# Patient Record
Sex: Female | Born: 1970 | Hispanic: No | Marital: Married | Smoking: Never smoker
Health system: Southern US, Community
[De-identification: ages and names within clinical notes are randomized; demographics above are authoritative.]

## PROBLEM LIST (undated history)

## (undated) DIAGNOSIS — I10 Essential (primary) hypertension: Secondary | ICD-10-CM

## (undated) DIAGNOSIS — E785 Hyperlipidemia, unspecified: Secondary | ICD-10-CM

## (undated) HISTORY — PX: APPENDECTOMY: SHX54

## (undated) HISTORY — PX: LEG SURGERY: SHX1003

---

## 2009-08-21 ENCOUNTER — Ambulatory Visit (HOSPITAL_COMMUNITY): Admission: RE | Admit: 2009-08-21 | Discharge: 2009-08-21 | Payer: Self-pay | Admitting: Specialist

## 2009-10-01 ENCOUNTER — Ambulatory Visit (HOSPITAL_COMMUNITY): Admission: RE | Admit: 2009-10-01 | Discharge: 2009-10-01 | Payer: Self-pay | Admitting: Specialist

## 2009-11-12 ENCOUNTER — Ambulatory Visit (HOSPITAL_COMMUNITY): Admission: RE | Admit: 2009-11-12 | Discharge: 2009-11-12 | Payer: Self-pay | Admitting: Specialist

## 2009-12-10 ENCOUNTER — Ambulatory Visit (HOSPITAL_COMMUNITY): Admission: RE | Admit: 2009-12-10 | Discharge: 2009-12-10 | Payer: Self-pay | Admitting: Specialist

## 2009-12-25 ENCOUNTER — Ambulatory Visit (HOSPITAL_COMMUNITY): Admission: RE | Admit: 2009-12-25 | Discharge: 2009-12-25 | Payer: Self-pay | Admitting: Specialist

## 2011-08-05 IMAGING — US US OB FOLLOW-UP
1 series · 18 of 28 positions shown · non-contrast
Comparison: none

OBSTETRICAL ULTRASOUND:
 This ultrasound was performed in The [HOSPITAL], and the [facility] report will be stored to [REDACTED] PACS.  This report is also available in [HOSPITAL]?s accessANYware.

[Series 1: us ob follow-up · 49 acquisitions, 18 frames shown]
[im 1/49]
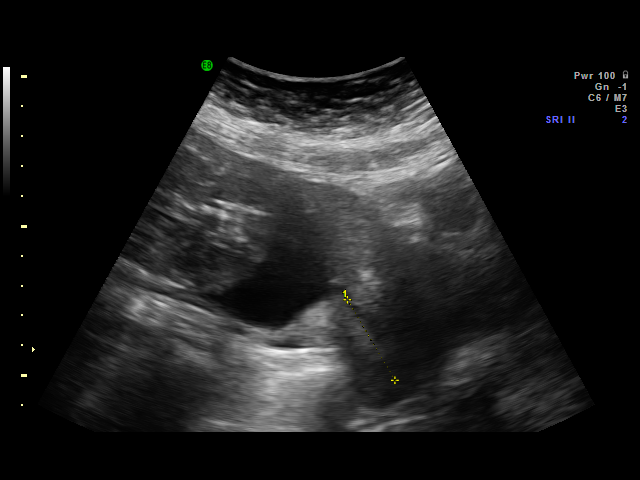
[im 4/49]
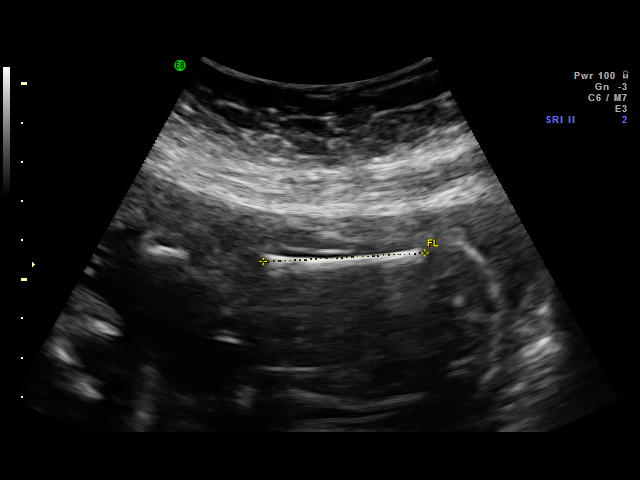
[im 6/49]
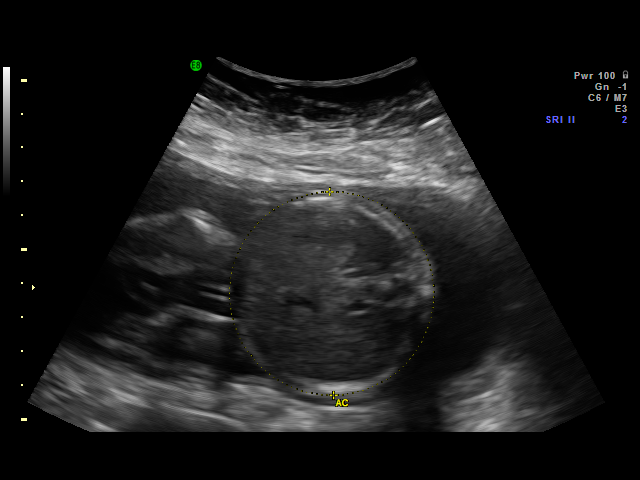
[im 9/49]
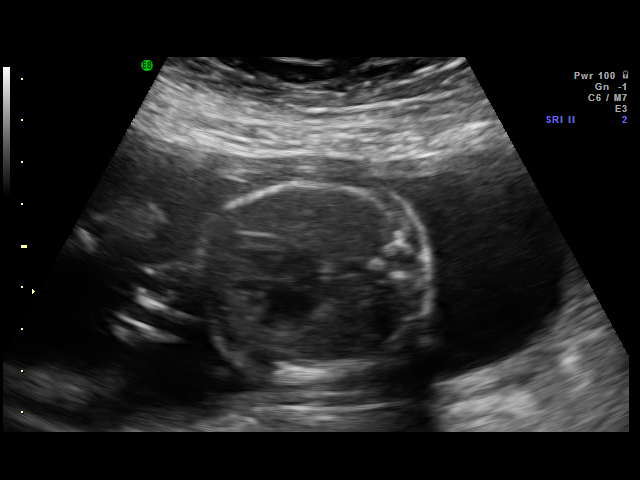
[im 13/49]
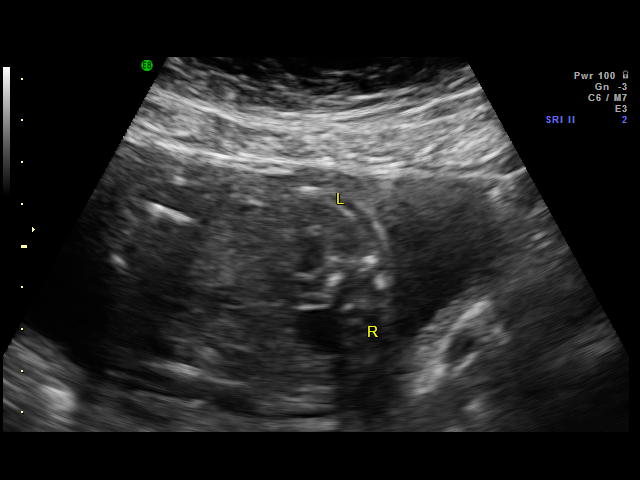
[im 15/49]
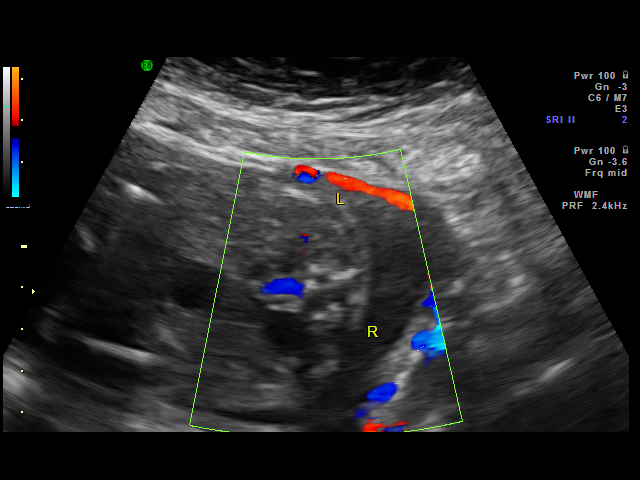
[im 18/49]
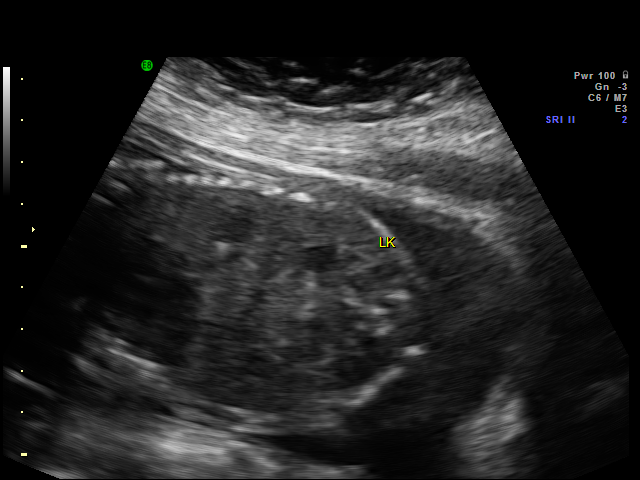
[im 20/49]
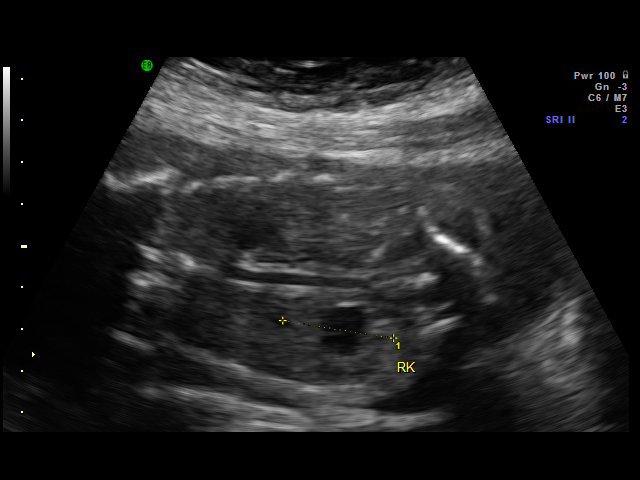
[im 24/49]
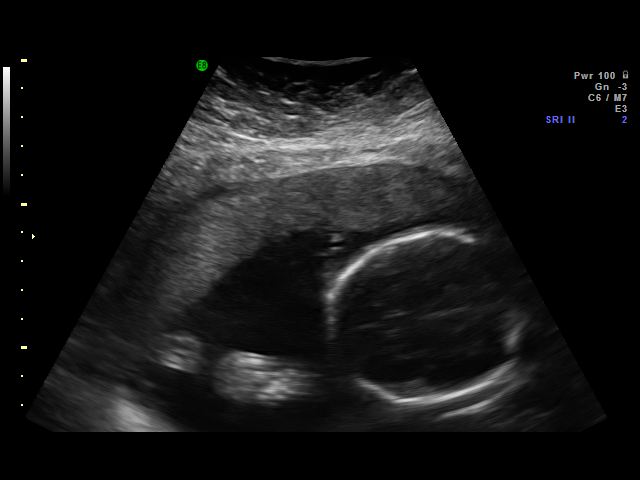
[im 25/49]
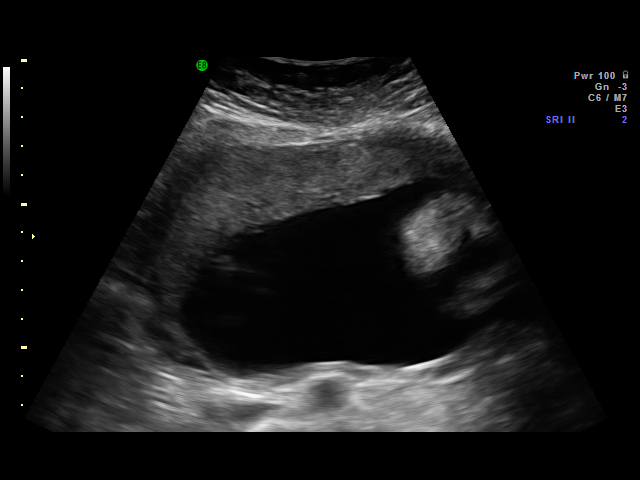
[im 29/49]
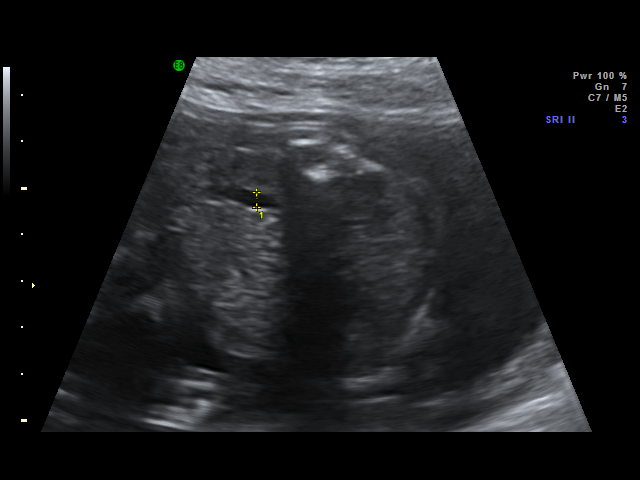
[im 31/49]
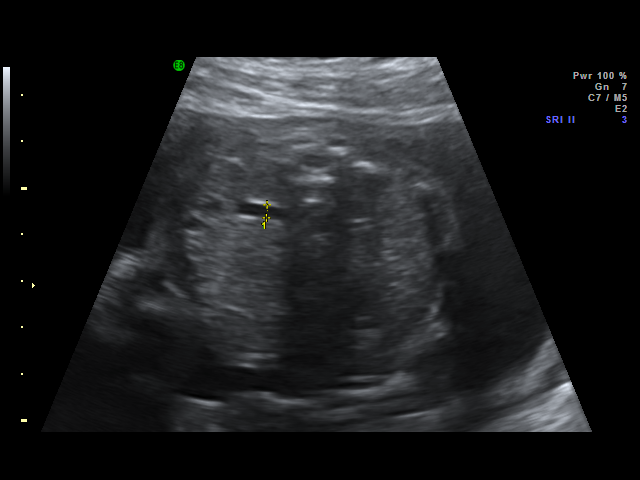
[im 34/49]
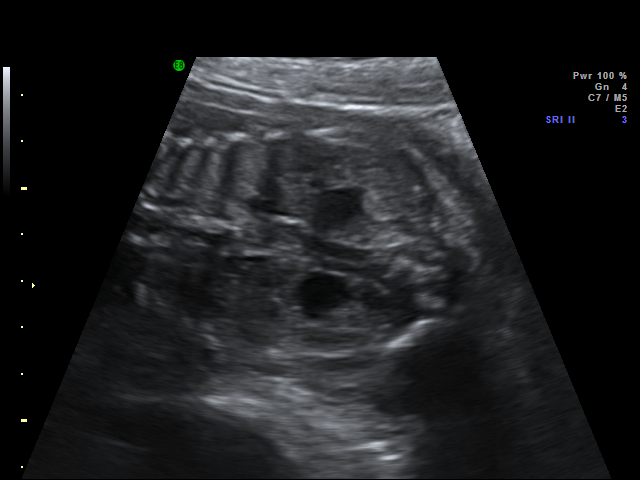
[im 38/49]
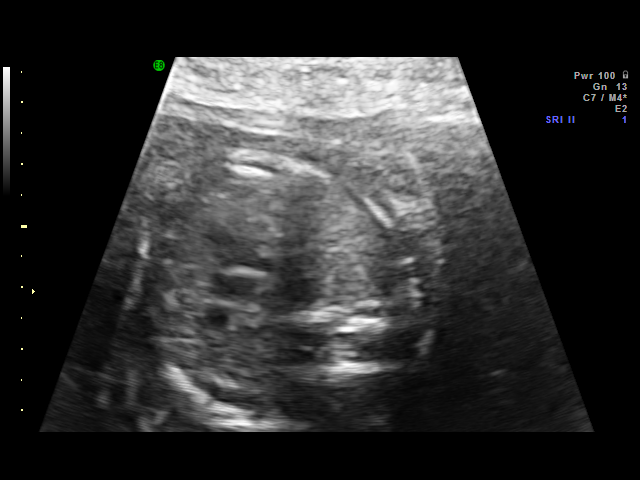
[im 40/49]
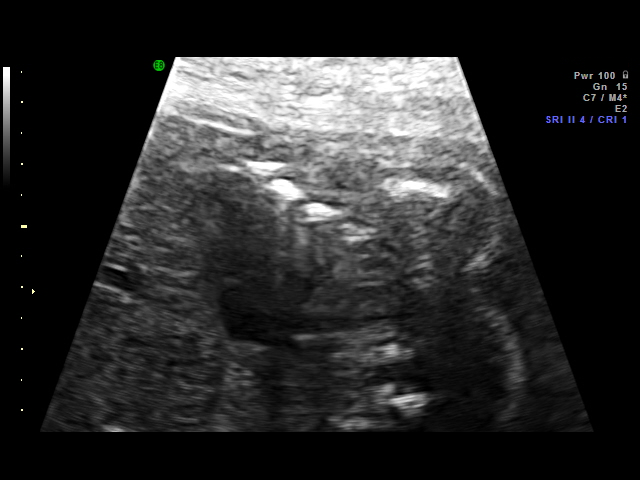
[im 43/49]
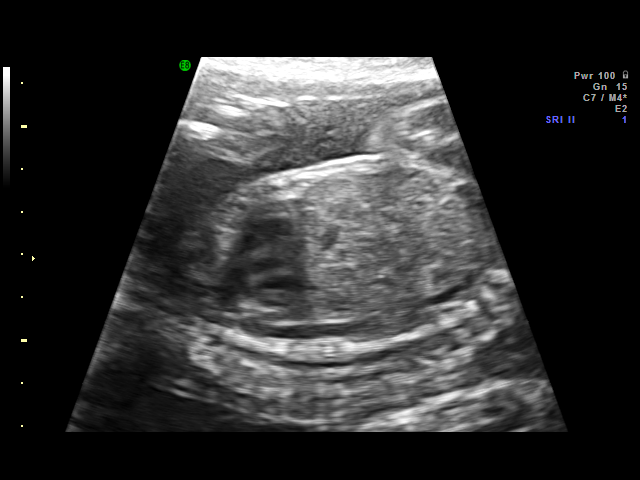
[im 45/49]
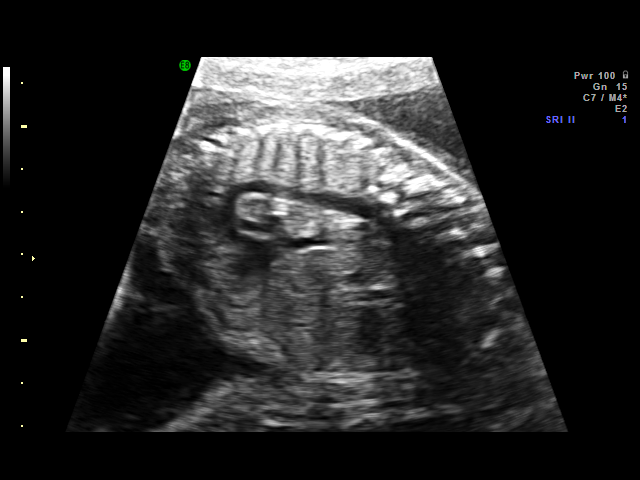
[im 49/49]
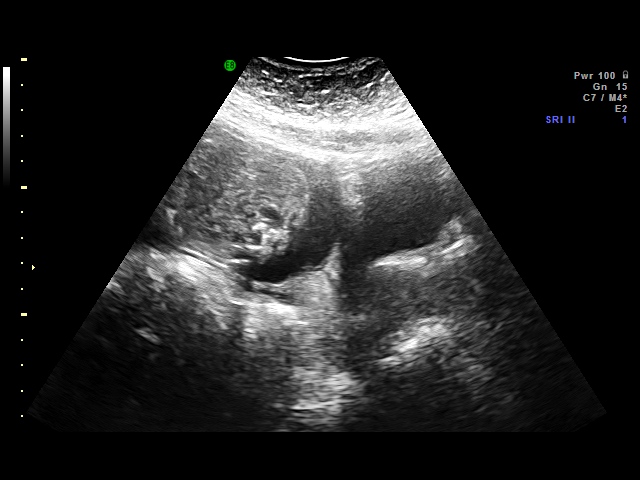

[18 of 28 positions shown; findings below may reference images not displayed]

IMPRESSION: [facility] has also been faxed to the ordering physician.

## 2011-10-29 IMAGING — US US OB FOLLOW-UP
1 series · 14 of 28 positions shown · non-contrast
Comparison: none

OBSTETRICAL ULTRASOUND:
 This ultrasound was performed in The [[facility]OSPITAL], and the [facility] report will be stored to [REDACTED] PACS.  This report is also available in [HOSPITAL]?s accessANYware.

[Series 1: us ob follow-up · 14 of 62 slices shown]
[im 3/62]
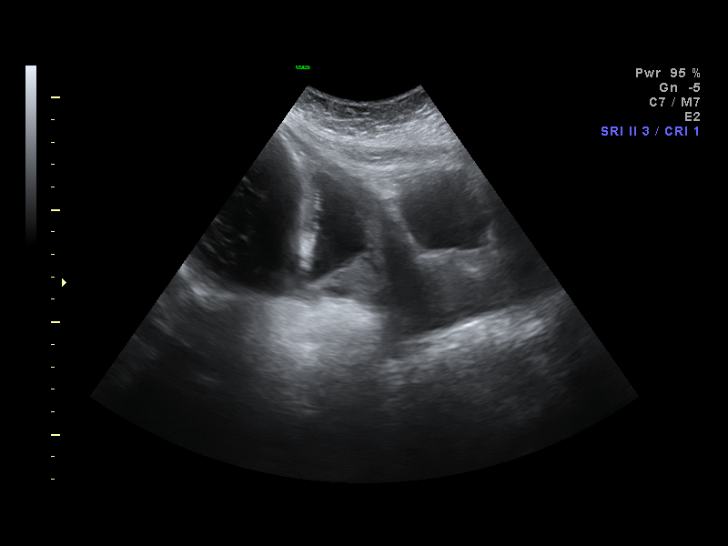
[im 7/62]
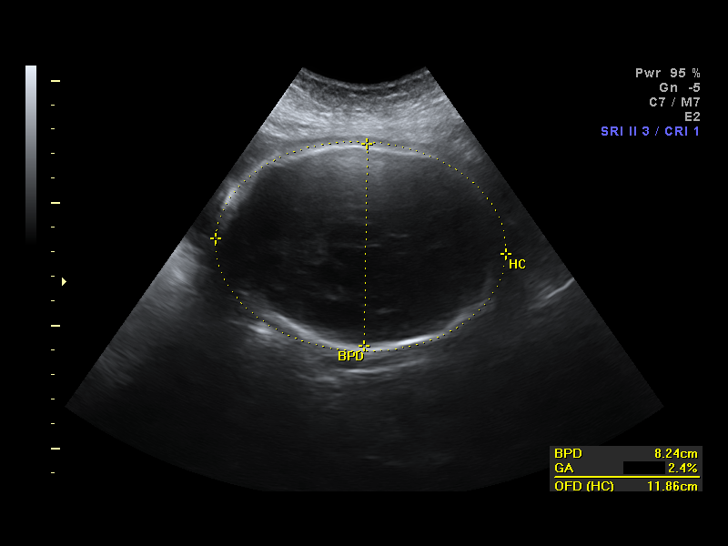
[im 12/62]
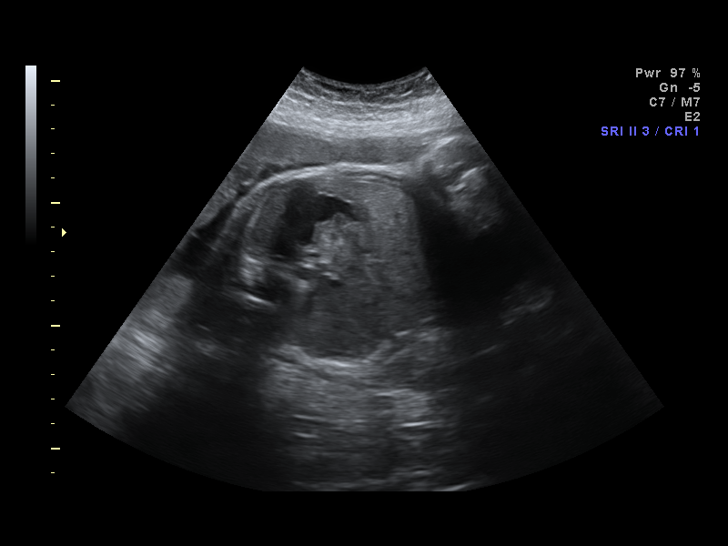
[im 16/62]
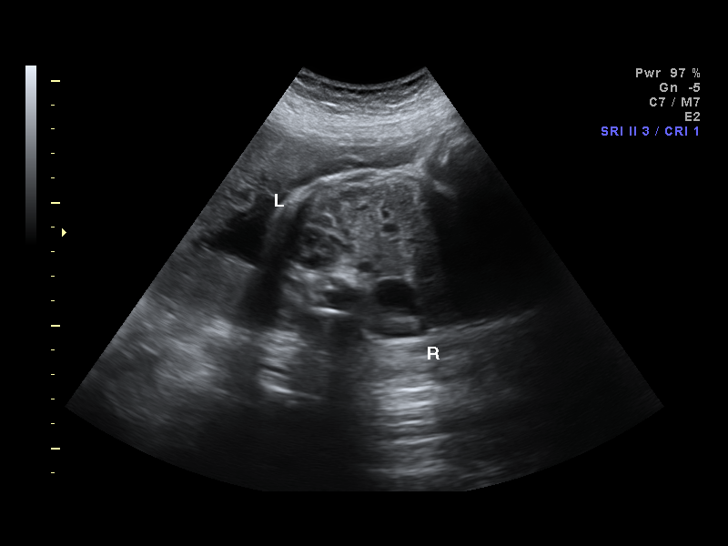
[im 21/62]
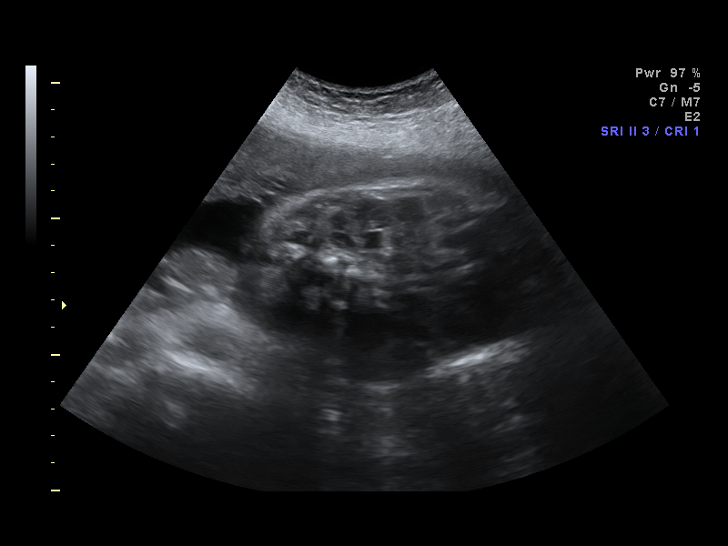
[im 25/62]
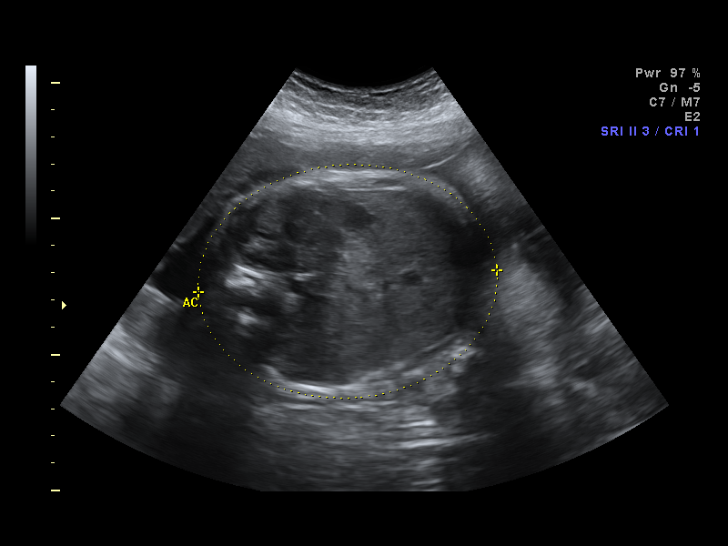
[im 30/62]
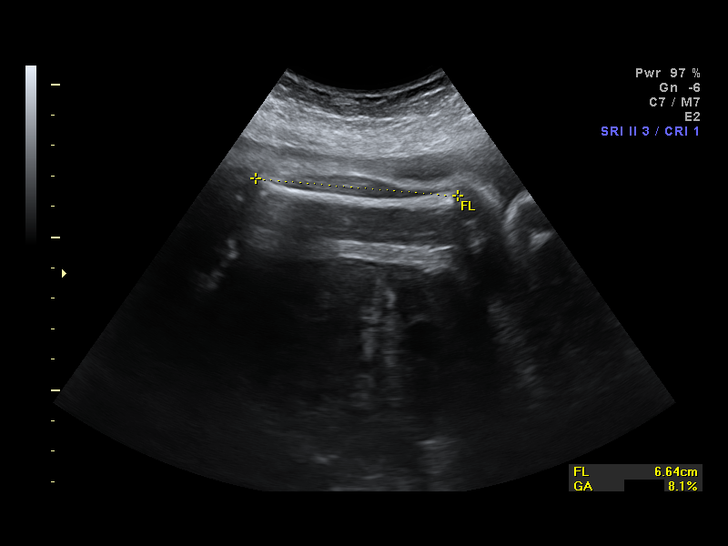
[im 34/62]
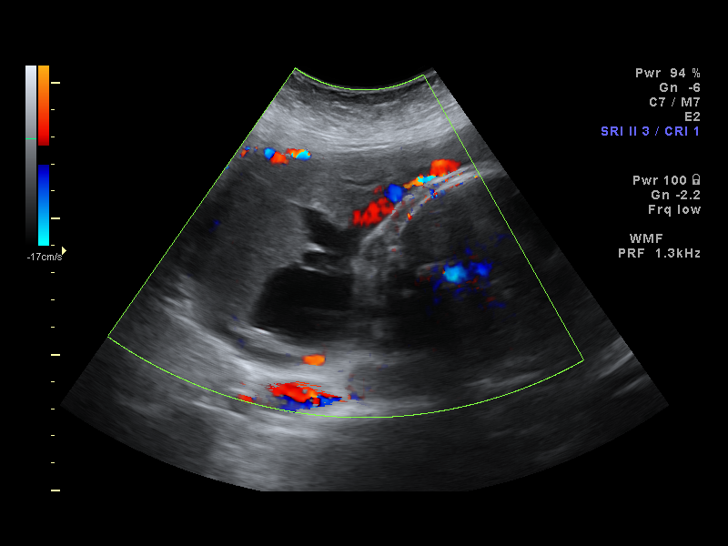
[im 39/62]
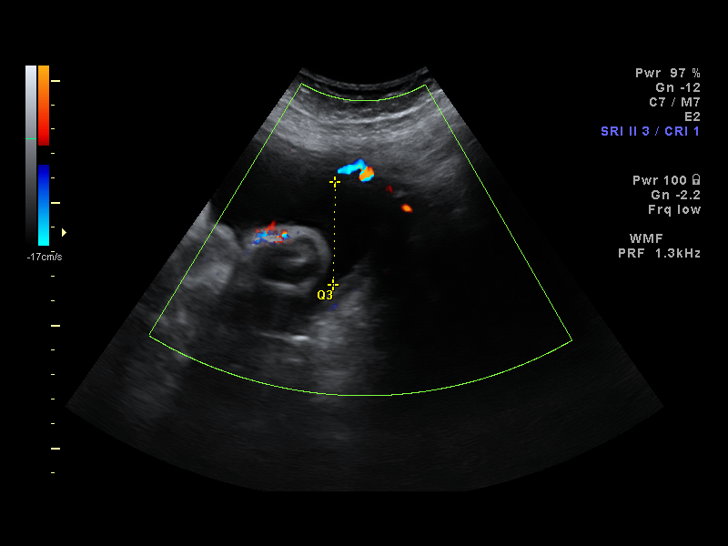
[im 43/62]
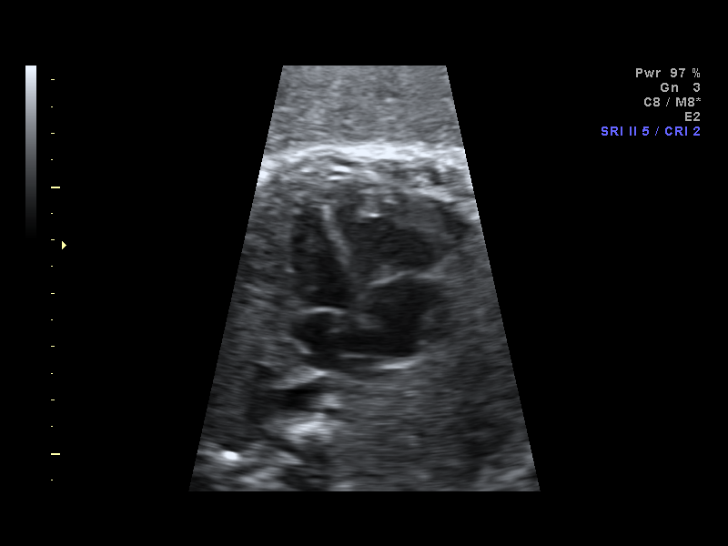
[im 48/62]
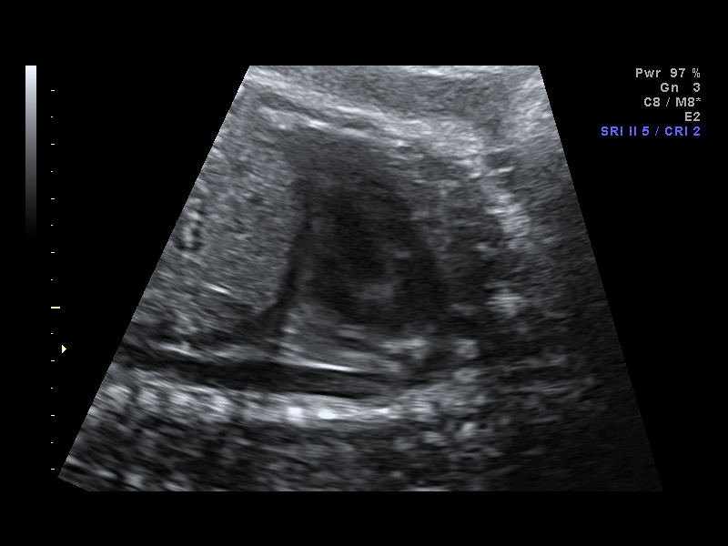
[im 52/62]
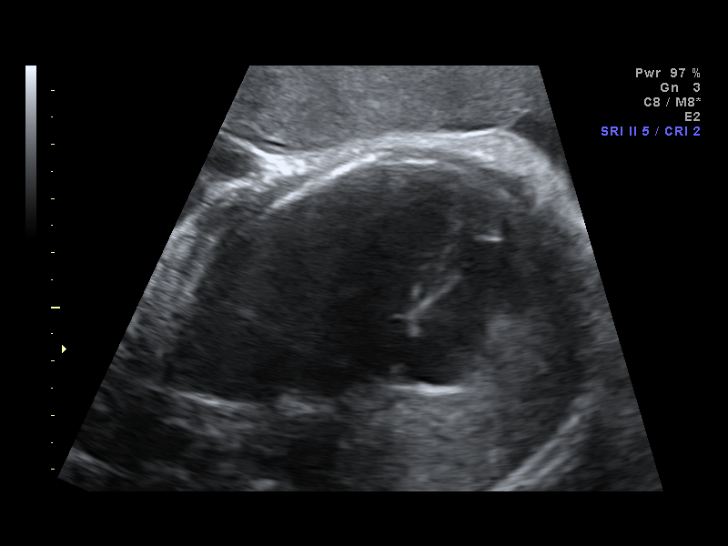
[im 57/62]
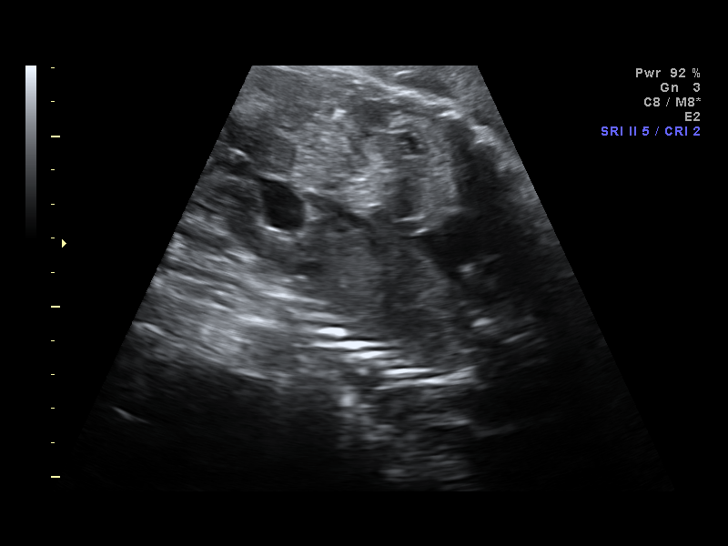
[im 62/62]
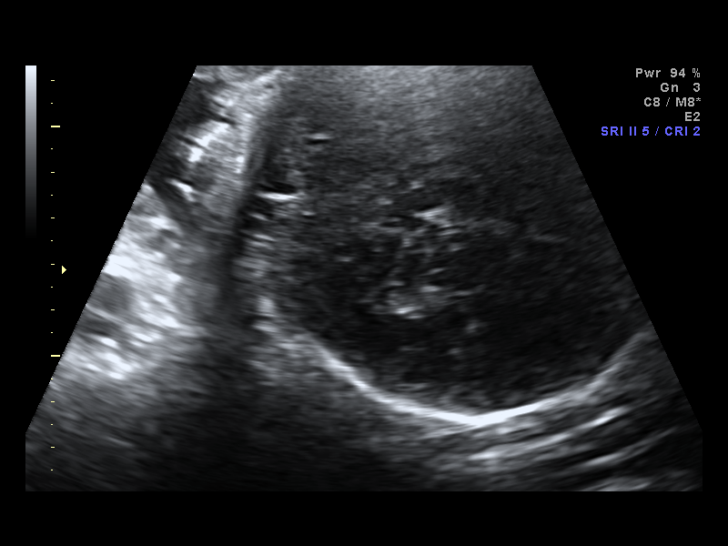

[14 of 28 positions shown; findings below may reference images not displayed]

IMPRESSION: [facility] has also been faxed to the ordering physician.

## 2021-05-14 ENCOUNTER — Emergency Department (HOSPITAL_COMMUNITY): Payer: BLUE CROSS/BLUE SHIELD

## 2021-05-14 ENCOUNTER — Encounter (HOSPITAL_COMMUNITY): Payer: Self-pay

## 2021-05-14 ENCOUNTER — Emergency Department (HOSPITAL_COMMUNITY)
Admission: EM | Admit: 2021-05-14 | Discharge: 2021-05-14 | Disposition: A | Discharge status: Home / Self Care | Payer: BLUE CROSS/BLUE SHIELD | Attending: Emergency Medicine | Admitting: Emergency Medicine

## 2021-05-14 ENCOUNTER — Other Ambulatory Visit: Payer: Self-pay

## 2021-05-14 DIAGNOSIS — I10 Essential (primary) hypertension: Secondary | ICD-10-CM | POA: Insufficient documentation

## 2021-05-14 DIAGNOSIS — E876 Hypokalemia: Secondary | ICD-10-CM | POA: Insufficient documentation

## 2021-05-14 DIAGNOSIS — Z79899 Other long term (current) drug therapy: Secondary | ICD-10-CM | POA: Insufficient documentation

## 2021-05-14 HISTORY — DX: Essential (primary) hypertension: I10

## 2021-05-14 LAB — COMPREHENSIVE METABOLIC PANEL
ALT: 32 U/L (ref 0–44)
AST: 32 U/L (ref 15–41)
Albumin: 4.5 g/dL (ref 3.5–5.0)
Alkaline Phosphatase: 90 U/L (ref 38–126)
Anion gap: 8 (ref 5–15)
BUN: 11 mg/dL (ref 6–20)
CO2: 25 mmol/L (ref 22–32)
Calcium: 8.9 mg/dL (ref 8.9–10.3)
Chloride: 107 mmol/L (ref 98–111)
Creatinine, Ser: 0.62 mg/dL (ref 0.44–1.00)
GFR, Estimated: >60 mL/min (ref >60)
Glucose, Bld: 128 mg/dL — ABNORMAL HIGH (ref 70–99)
Potassium: 3 mmol/L — ABNORMAL LOW (ref 3.5–5.1)
Sodium: 140 mmol/L (ref 135–145)
Total Bilirubin: 0.6 mg/dL (ref 0.3–1.2)
Total Protein: 7.8 g/dL (ref 6.5–8.1)

## 2021-05-14 LAB — CBC WITH DIFFERENTIAL/PLATELET
Abs Immature Granulocytes: 0.03 10*3/uL (ref 0.00–0.07)
Basophils Absolute: 0.1 10*3/uL (ref 0.0–0.1)
Basophils Relative: 1 %
Eosinophils Absolute: 0 10*3/uL (ref 0.0–0.5)
Eosinophils Relative: 1 %
HCT: 42.5 % (ref 36.0–46.0)
Hemoglobin: 15 g/dL (ref 12.0–15.0)
Immature Granulocytes: 0 %
Lymphocytes Relative: 29 %
Lymphs Abs: 2.3 10*3/uL (ref 0.7–4.0)
MCH: 33.3 pg (ref 26.0–34.0)
MCHC: 35.3 g/dL (ref 30.0–36.0)
MCV: 94.4 fL (ref 80.0–100.0)
Monocytes Absolute: 0.4 10*3/uL (ref 0.1–1.0)
Monocytes Relative: 5 %
Neutro Abs: 5.1 10*3/uL (ref 1.7–7.7)
Neutrophils Relative %: 64 %
Platelets: 245 10*3/uL (ref 150–400)
RBC: 4.5 MIL/uL (ref 3.87–5.11)
RDW: 11.9 % (ref 11.5–15.5)
WBC: 7.9 10*3/uL (ref 4.0–10.5)
nRBC: 0 % (ref 0.0–0.2)

## 2021-05-14 LAB — TSH: TSH: 3.458 u[IU]/mL (ref 0.350–4.500)

## 2021-05-14 LAB — TROPONIN I (HIGH SENSITIVITY)
Troponin I (High Sensitivity): 5 ng/L (ref <18)
Troponin I (High Sensitivity): 5 ng/L (ref <18)

## 2021-05-14 LAB — D-DIMER, QUANTITATIVE: D-Dimer, Quant: 0.31 ug/mL-FEU (ref 0.00–0.50)

## 2021-05-14 MED ORDER — METOPROLOL TARTRATE 5 MG/5ML IV SOLN
5.0000 mg | Freq: Once | INTRAVENOUS | Status: AC
  Administered 2021-05-14: 5 mg via INTRAVENOUS
  Filled 2021-05-14: qty 5

## 2021-05-14 MED ORDER — SODIUM CHLORIDE 0.9 % IV BOLUS
500.0000 mL | Freq: Once | INTRAVENOUS | Status: AC
  Administered 2021-05-14: 500 mL via INTRAVENOUS

## 2021-05-14 MED ORDER — METOPROLOL SUCCINATE ER 50 MG PO TB24: 50.0000 mg | ORAL_TABLET | Freq: Every day | ORAL | 0 refills | Status: DC

## 2021-05-14 MED ORDER — POTASSIUM CHLORIDE CRYS ER 20 MEQ PO TBCR
40.0000 meq | EXTENDED_RELEASE_TABLET | Freq: Once | ORAL | Status: AC
  Administered 2021-05-14: 40 meq via ORAL
  Filled 2021-05-14: qty 2

## 2021-05-14 MED ORDER — METOPROLOL SUCCINATE ER 50 MG PO TB24
50.0000 mg | ORAL_TABLET | Freq: Every day | ORAL | Status: DC
  Administered 2021-05-14: 50 mg via ORAL
  Filled 2021-05-14: qty 1

## 2021-05-14 NOTE — ED Provider Notes (Signed)
[facility] EMERGENCY DEPARTMENT Provider Note   CSN: [id number] Arrival date & time: 05/14/21  1516     History Chief Complaint  Patient presents with   Hypertension    Ashlee Brown is a 50 y.o. female.  Patient with a history of hypertension.  She was sent over from the doctor's office for high blood pressure and tachycardia.  Patient has no complaint  The history is provided by the patient and medical records. No language interpreter was used.  Hypertension This is a new problem. Episode onset: Unknown. The problem occurs constantly. The problem has not changed since onset.Pertinent negatives include no chest pain, no abdominal pain and no headaches. Nothing aggravates the symptoms. Nothing relieves the symptoms. She has tried nothing for the symptoms.      Past Medical History:  Diagnosis Date   Hypertension     There are no problems to display for this patient.   Past Surgical History:  Procedure Laterality Date   APPENDECTOMY     CESAREAN SECTION     LEG SURGERY Left      OB History   No obstetric history on file.     History reviewed. No pertinent family history.  Social History   Tobacco Use   Smoking status: Never   Smokeless tobacco: Never  Substance Use Topics   Alcohol use: Yes    Comment: Occasionaly   Drug use: Never    Home Medications Prior to Admission medications   Medication Sig Start Date End Date Taking? Authorizing Provider  Barberry-Oreg Grape-Goldenseal (BERBERINE COMPLEX PO) Take 1 tablet by mouth See admin instructions. Take 1 tablet 2 to 3 times aday   Yes [provider]  benazepril (LOTENSIN) 20 MG tablet Take 20 mg by mouth daily. 05/14/21  Yes [provider]  metoprolol succinate (TOPROL XL) 50 MG 24 hr tablet Take 1 tablet (50 mg total) by mouth daily. Take with or immediately following a meal. 05/14/21  Yes Bethann Berkshire, MD  pravastatin (PRAVACHOL) 40 MG tablet Take 40 mg by mouth daily. 05/14/21  Yes  [provider]    Allergies    Patient has no known allergies.  Review of Systems   Review of Systems  Constitutional:  Negative for appetite change and fatigue.  HENT:  Negative for congestion, ear discharge and sinus pressure.   Eyes:  Negative for discharge.  Respiratory:  Negative for cough.   Cardiovascular:  Negative for chest pain.  Gastrointestinal:  Negative for abdominal pain and diarrhea.  Genitourinary:  Negative for frequency and hematuria.  Musculoskeletal:  Negative for back pain.  Skin:  Negative for rash.  Neurological:  Negative for seizures and headaches.  Psychiatric/Behavioral:  Negative for hallucinations.    Physical Exam Updated Vital Signs BP (!) 181/116   Pulse 88   Temp 98.6 F (37 C) (Oral)   Resp 18   Ht 5\' 2"  (1.575 m)   Wt 77.1 kg   SpO2 99%   BMI 31.09 kg/m   Physical Exam Vitals and nursing note reviewed.  Constitutional:      Appearance: She is well-developed.  HENT:     Head: Normocephalic.     Nose: Nose normal.  Eyes:     General: No scleral icterus.    Conjunctiva/sclera: Conjunctivae normal.  Neck:     Thyroid: No thyromegaly.  Cardiovascular:     Rate and Rhythm: Normal rate and regular rhythm.     Heart sounds: No murmur heard.   No  friction rub. No gallop.  Pulmonary:     Breath sounds: No stridor. No wheezing or rales.  Chest:     Chest wall: No tenderness.  Abdominal:     General: There is no distension.     Tenderness: There is no abdominal tenderness. There is no rebound.  Musculoskeletal:        General: Normal range of motion.     Cervical back: Neck supple.  Lymphadenopathy:     Cervical: No cervical adenopathy.  Skin:    Findings: No erythema or rash.  Neurological:     Mental Status: She is alert and oriented to person, place, and time.     Motor: No abnormal muscle tone.     Coordination: Coordination normal.  Psychiatric:        Behavior: Behavior normal.    ED Results / Procedures /  Treatments   Labs (all labs ordered are listed, but only abnormal results are displayed) Labs Reviewed  COMPREHENSIVE METABOLIC PANEL - Abnormal; Notable for the following components:      Result Value   Potassium 3.0 (*)    Glucose, Bld 128 (*)    All other components within normal limits  CBC WITH DIFFERENTIAL/PLATELET  D-DIMER, QUANTITATIVE  TSH  TROPONIN I (HIGH SENSITIVITY)  TROPONIN I (HIGH SENSITIVITY)    EKG EKG Interpretation  Date/Time:  Wednesday May 14 2021 15:41:30 EDT Ventricular Rate:  113 PR Interval:  136 QRS Duration: 66 QT Interval:  336 QTC Calculation: 460 R Axis:   242 Text Interpretation: Sinus tachycardia Low voltage QRS Inferior infarct , age undetermined Possible Anterolateral infarct , age undetermined Abnormal ECG Confirmed by Bethann Berkshire [id number]) on 05/14/2021 6:01:52 PM  Radiology No results found.  Procedures Procedures   Medications Ordered in ED Medications  potassium chloride SA (KLOR-CON) CR tablet 40 mEq (has no administration in time range)  metoprolol succinate (TOPROL-XL) 24 hr tablet 50 mg (has no administration in time range)  sodium chloride 0.9 % bolus 500 mL (0 mLs Intravenous Stopped 05/14/21 1734)  metoprolol tartrate (LOPRESSOR) injection 5 mg (5 mg Intravenous Given 05/14/21 1643)    ED Course  I have reviewed the triage vital signs and the nursing notes.  Pertinent labs & imaging results that were available during my care of the patient were reviewed by me and considered in my medical decision making (see chart for details).    MDM Rules/Calculators/A&P                           Patient with poorly controlled blood pressure mild hypokalemia and EKG changes suggesting possible old MI.  Patient blood pressure improved with a beta-blocker.  She will be discharged home on Toprol-XL and referred to cardiology for blood pressure and her EKG changes Final Clinical Impression(s) / ED Diagnoses Final diagnoses:   Primary hypertension    Rx / DC Orders ED Discharge Orders          Ordered    metoprolol succinate (TOPROL XL) 50 MG 24 hr tablet  Daily        05/14/21 1805             Bethann Berkshire, MD 05/19/21 1042

## 2021-05-14 NOTE — Discharge Instructions (Addendum)
Follow-up with Dr. Wyline Mood or one of his associates next week for your blood pressure and your changes on your EKG

## 2021-05-14 NOTE — ED Triage Notes (Signed)
Pt. States they were seen at [facility] for high blood pressure. The doctors office took an EKG and told pt. To go to the ER.

## 2021-06-01 ENCOUNTER — Other Ambulatory Visit: Payer: Self-pay

## 2021-06-01 DIAGNOSIS — I10 Essential (primary) hypertension: Secondary | ICD-10-CM | POA: Diagnosis not present

## 2021-06-01 DIAGNOSIS — Z79899 Other long term (current) drug therapy: Secondary | ICD-10-CM | POA: Insufficient documentation

## 2021-06-01 DIAGNOSIS — R000 Tachycardia, unspecified: Secondary | ICD-10-CM | POA: Insufficient documentation

## 2021-06-01 DIAGNOSIS — R42 Dizziness and giddiness: Secondary | ICD-10-CM | POA: Diagnosis not present

## 2021-06-02 ENCOUNTER — Encounter (HOSPITAL_COMMUNITY): Payer: Self-pay

## 2021-06-02 ENCOUNTER — Emergency Department (HOSPITAL_COMMUNITY)
Admission: EM | Admit: 2021-06-02 | Discharge: 2021-06-02 | Disposition: A | Discharge status: Home / Self Care | Payer: BLUE CROSS/BLUE SHIELD | Attending: Emergency Medicine | Admitting: Emergency Medicine

## 2021-06-02 ENCOUNTER — Other Ambulatory Visit: Payer: Self-pay

## 2021-06-02 DIAGNOSIS — R000 Tachycardia, unspecified: Secondary | ICD-10-CM

## 2021-06-02 DIAGNOSIS — R030 Elevated blood-pressure reading, without diagnosis of hypertension: Secondary | ICD-10-CM

## 2021-06-02 LAB — CBC WITH DIFFERENTIAL/PLATELET
Abs Immature Granulocytes: 0.02 10*3/uL (ref 0.00–0.07)
Basophils Absolute: 0 10*3/uL (ref 0.0–0.1)
Basophils Relative: 1 %
Eosinophils Absolute: 0 10*3/uL (ref 0.0–0.5)
Eosinophils Relative: 1 %
HCT: 42.5 % (ref 36.0–46.0)
Hemoglobin: 14.8 g/dL (ref 12.0–15.0)
Immature Granulocytes: 0 %
Lymphocytes Relative: 28 %
Lymphs Abs: 2.4 10*3/uL (ref 0.7–4.0)
MCH: 33.1 pg (ref 26.0–34.0)
MCHC: 34.8 g/dL (ref 30.0–36.0)
MCV: 95.1 fL (ref 80.0–100.0)
Monocytes Absolute: 0.5 10*3/uL (ref 0.1–1.0)
Monocytes Relative: 6 %
Neutro Abs: 5.5 10*3/uL (ref 1.7–7.7)
Neutrophils Relative %: 64 %
Platelets: 213 10*3/uL (ref 150–400)
RBC: 4.47 MIL/uL (ref 3.87–5.11)
RDW: 11.2 % — ABNORMAL LOW (ref 11.5–15.5)
WBC: 8.4 10*3/uL (ref 4.0–10.5)
nRBC: 0 % (ref 0.0–0.2)

## 2021-06-02 LAB — COMPREHENSIVE METABOLIC PANEL
ALT: 26 U/L (ref 0–44)
AST: 30 U/L (ref 15–41)
Albumin: 4.4 g/dL (ref 3.5–5.0)
Alkaline Phosphatase: 98 U/L (ref 38–126)
Anion gap: 11 (ref 5–15)
BUN: 20 mg/dL (ref 6–20)
CO2: 22 mmol/L (ref 22–32)
Calcium: 9 mg/dL (ref 8.9–10.3)
Chloride: 105 mmol/L (ref 98–111)
Creatinine, Ser: 0.88 mg/dL (ref 0.44–1.00)
GFR, Estimated: >60 mL/min (ref >60)
Glucose, Bld: 127 mg/dL — ABNORMAL HIGH (ref 70–99)
Potassium: 3.5 mmol/L (ref 3.5–5.1)
Sodium: 138 mmol/L (ref 135–145)
Total Bilirubin: 0.8 mg/dL (ref 0.3–1.2)
Total Protein: 7.5 g/dL (ref 6.5–8.1)

## 2021-06-02 LAB — TSH: TSH: 3.585 u[IU]/mL (ref 0.350–4.500)

## 2021-06-02 LAB — TROPONIN I (HIGH SENSITIVITY)
Troponin I (High Sensitivity): 7 ng/L (ref <18)
Troponin I (High Sensitivity): 7 ng/L (ref <18)

## 2021-06-02 LAB — D-DIMER, QUANTITATIVE: D-Dimer, Quant: <0.27 ug/mL-FEU (ref 0.00–0.50)

## 2021-06-02 LAB — MAGNESIUM: Magnesium: 1.6 mg/dL — ABNORMAL LOW (ref 1.7–2.4)

## 2021-06-02 MED ORDER — POTASSIUM CHLORIDE CRYS ER 20 MEQ PO TBCR
40.0000 meq | EXTENDED_RELEASE_TABLET | Freq: Once | ORAL | Status: AC
  Administered 2021-06-02: 40 meq via ORAL
  Filled 2021-06-02: qty 2

## 2021-06-02 MED ORDER — MAGNESIUM SULFATE 2 GM/50ML IV SOLN
2.0000 g | Freq: Once | INTRAVENOUS | Status: AC
  Administered 2021-06-02: 2 g via INTRAVENOUS
  Filled 2021-06-02: qty 50

## 2021-06-02 MED ORDER — LORAZEPAM 1 MG PO TABS
1.0000 mg | ORAL_TABLET | Freq: Once | ORAL | Status: AC
  Administered 2021-06-02: 1 mg via ORAL
  Filled 2021-06-02: qty 1

## 2021-06-02 MED ORDER — SODIUM CHLORIDE 0.9 % IV BOLUS
1000.0000 mL | Freq: Once | INTRAVENOUS | Status: AC
  Administered 2021-06-02: 1000 mL via INTRAVENOUS

## 2021-06-02 MED ORDER — AMLODIPINE BESYLATE 5 MG PO TABS
5.0000 mg | ORAL_TABLET | Freq: Once | ORAL | Status: AC
  Administered 2021-06-02: 5 mg via ORAL
  Filled 2021-06-02: qty 1

## 2021-06-02 MED ORDER — AMLODIPINE BESYLATE 5 MG PO TABS: 5.0000 mg | ORAL_TABLET | Freq: Every day | ORAL | 0 refills | Status: DC

## 2021-06-02 NOTE — ED Notes (Signed)
Pt states she was prescribed HCTZ but has not taken it for 4 days due to it making her feel dizzy. States she took a dose tonight pta and that's when she started feeling dizzy again.

## 2021-06-02 NOTE — Discharge Instructions (Signed)
Keep a record of your blood pressure as you are doing.  Stop taking hydrochlorothiazide and take amlodipine in addition to your benazepril.  Follow-up with your cardiologist tomorrow as scheduled. Return to the ED with exertional chest pain, shortness of breath, difficulty speaking, difficulty swallowing, unilateral weakness, numbness or tingling or any other concerns.

## 2021-06-02 NOTE — ED Provider Notes (Signed)
[facility] EMERGENCY DEPARTMENT Provider Note   CSN: [id number] Arrival date & time: 06/01/21  2314     History Chief Complaint  Patient presents with   Hypertension    Ashlee Brown is a 50 y.o. female.  Patient with a history of hypertension here with palpitations, elevated blood pressure, racing heart and dizziness.  States she has been monitoring her blood pressure at home because she was recently seen in the ED with hypertension.  She is on benazepril as well as hydrochlorothiazide.  She has not taken her hydrochlorothiazide for about 4 days due to feeling lightheaded.  She states her blood pressure has been normal until she checked it tonight and was over 180 systolic.  She then took a dose of hydrochlorothiazide and began to feel dizzy and lightheaded with palpitations.  No chest pain or shortness of breath.  No nausea or vomiting.  No cough or fever. No room spinning dizziness.  No focal weakness, numbness or tingling.  No difficulty breathing or difficulty swallowing.  She was recently seen in the ED in September with elevated blood pressure as well.  She was prescribed metoprolol at that time which patient reports dropped her heart rate to the 40s.  Her PCP then switch her to hydrochlorothiazide.  Did not take it for several days and reports her blood pressure was doing doing well until tonight.  After seeing elevated blood pressure tonight she took hydrochlorothiazide and then began to feel lightheaded again.  This is when the palpitations began as well.  Denies any excessive alcohol or caffeine use.  Denies any history of alcohol withdrawal.  Denies any other drug use.  Denies any thyroid issues. No headache or visual changes  The history is provided by the patient and the spouse.  Hypertension Pertinent negatives include no chest pain, no abdominal pain and no shortness of breath.      Past Medical History:  Diagnosis Date   Hypertension     There are no problems to  display for this patient.   Past Surgical History:  Procedure Laterality Date   APPENDECTOMY     CESAREAN SECTION     LEG SURGERY Left      OB History   No obstetric history on file.     History reviewed. No pertinent family history.  Social History   Tobacco Use   Smoking status: Never   Smokeless tobacco: Never  Substance Use Topics   Alcohol use: Yes    Comment: Occasionaly   Drug use: Never    Home Medications Prior to Admission medications   Medication Sig Start Date End Date Taking? Authorizing Provider  Barberry-Oreg Grape-Goldenseal (BERBERINE COMPLEX PO) Take 1 tablet by mouth See admin instructions. Take 1 tablet 2 to 3 times aday    [provider]  benazepril (LOTENSIN) 20 MG tablet Take 20 mg by mouth daily. 05/14/21   [provider]  metoprolol succinate (TOPROL XL) 50 MG 24 hr tablet Take 1 tablet (50 mg total) by mouth daily. Take with or immediately following a meal. 05/14/21   Bethann Berkshire, MD  pravastatin (PRAVACHOL) 40 MG tablet Take 40 mg by mouth daily. 05/14/21   [provider]    Allergies    Patient has no known allergies.  Review of Systems   Review of Systems  Constitutional:  Negative for activity change and fever.  HENT:  Negative for congestion and rhinorrhea.   Respiratory:  Negative for shortness of breath.   Cardiovascular:  Positive for palpitations. Negative for chest pain.  Gastrointestinal:  Negative for abdominal pain, nausea and vomiting.  Genitourinary:  Negative for dysuria and hematuria.  Musculoskeletal:  Negative for arthralgias and myalgias.  Skin:  Negative for wound.  Neurological:  Positive for dizziness and light-headedness. Negative for weakness.   all other systems are negative except as noted in the HPI and PMH.   Physical Exam Updated Vital Signs BP (!) 163/109   Pulse (!) 123   Temp 98.4 F (36.9 C) (Oral)   Resp 15   Ht 5\' 2"  (1.575 m)   Wt 74.8 kg   SpO2 99%   BMI 30.18  kg/m   Physical Exam Vitals and nursing note reviewed.  Constitutional:      General: She is not in acute distress.    Appearance: She is well-developed.     Comments: Appears anxious  HENT:     Head: Normocephalic and atraumatic.     Mouth/Throat:     Pharynx: No oropharyngeal exudate.  Eyes:     Conjunctiva/sclera: Conjunctivae normal.     Pupils: Pupils are equal, round, and reactive to light.  Neck:     Comments: No meningismus. Cardiovascular:     Rate and Rhythm: Regular rhythm. Tachycardia present.     Heart sounds: Normal heart sounds. No murmur heard. Pulmonary:     Effort: Pulmonary effort is normal. No respiratory distress.     Breath sounds: Normal breath sounds.  Abdominal:     Palpations: Abdomen is soft.     Tenderness: There is no abdominal tenderness. There is no guarding or rebound.  Musculoskeletal:        General: No tenderness. Normal range of motion.     Cervical back: Normal range of motion and neck supple.  Skin:    General: Skin is warm.  Neurological:     Mental Status: She is alert and oriented to person, place, and time.     Cranial Nerves: No cranial nerve deficit.     Motor: No abnormal muscle tone.     Coordination: Coordination normal.     Comments: CN 2-12 intact, no ataxia on finger to nose, no nystagmus, 5/5 strength throughout, no pronator drift, Romberg negative, normal gait.   Psychiatric:        Behavior: Behavior normal.    ED Results / Procedures / Treatments   Labs (all labs ordered are listed, but only abnormal results are displayed) Labs Reviewed  CBC WITH DIFFERENTIAL/PLATELET - Abnormal; Notable for the following components:      Result Value   RDW 11.2 (*)    All other components within normal limits  COMPREHENSIVE METABOLIC PANEL - Abnormal; Notable for the following components:   Glucose, Bld 127 (*)    All other components within normal limits  MAGNESIUM - Abnormal; Notable for the following components:    Magnesium 1.6 (*)    All other components within normal limits  TSH  D-DIMER, QUANTITATIVE  TROPONIN I (HIGH SENSITIVITY)  TROPONIN I (HIGH SENSITIVITY)  TROPONIN I (HIGH SENSITIVITY)    EKG EKG Interpretation  Date/Time:  Monday June 02 2021 00:29:38 EDT Ventricular Rate:  123 PR Interval:  145 QRS Duration: 90 QT Interval:  335 QTC Calculation: 480 R Axis:   -25 Text Interpretation: Sinus tachycardia Probable inferior infarct, old Consider anterior infarct Baseline wander in lead(s) V6 No significant change was found Confirmed by [id number] [phone]) on 06/02/2021 12:52:45 AM  Radiology No results found.  Procedures Procedures   Medications Ordered in ED Medications  sodium chloride 0.9 % bolus 1,000 mL (has no administration in time range)  LORazepam (ATIVAN) tablet 1 mg (has no administration in time range)    ED Course  I have reviewed the triage vital signs and the nursing notes.  Pertinent labs & imaging results that were available during my care of the patient were reviewed by me and considered in my medical decision making (see chart for details).    MDM Rules/Calculators/A&P                          Palpitations with elevated heart rate and lightheadedness.  No chest pain or shortness of breath.  No room spinning dizziness.  EKG is sinus tachycardia with inferior and anterior Q waves similar to previous.  Labs reassuring.  Troponin and D-dimer are negative.  TSH is normal.  Patient denies any excessive caffeine or substance abuse.  Denies regular alcohol use so alcohol withdrawal considered less likely.  Heart rate did improve with some Ativan to the 100s.  Anxiety may be playing a role.  EKG with mildly prolonged QT as well as inferior anterior Q waves. Discussed with Dr. Cherly Beach of cardiology who reviewed EKGs.  He agrees with patient not tolerating hydrochlorothiazide, may start low-dose amlodipine will be reasonable.  Continue benazepril.  May  be reasonable to follow-up with cardiology for Holter monitor given her persistent tachycardia with mildly prolonged QT. No emergent treatment needed for prolonged QT.  Potassium is normal.  Will check magnesium.  Patient feels improved.  Resolution of palpitations and lightheadedness.  She is tolerating p.o. and ambulatory.  Troponin negative x2.  Heart rate is improved to about 100.  Will initiate low-dose amlodipine.  Discussed need for cardiology follow-up with the patient.  States she is actually scheduled to see Dr. Diona Browner on October 11.  Advised to keep this appointment and he will make further medication adjustments as necessary.  Return to the ED with exertional chest pain, shortness of breath, unilateral weakness, numbness, tingling, difficulty speaking, difficulty swallowing or any other concerns. Final Clinical Impression(s) / ED Diagnoses Final diagnoses:  Elevated blood pressure reading  Tachycardia    Rx / DC Orders ED Discharge Orders     None        Alixandrea Milleson, Jeannett Senior, MD 06/02/21 [phone]

## 2021-06-02 NOTE — ED Triage Notes (Signed)
Pt arrives from home c/o hypertension. Pt states she felt dizzy while lying down earlier tonight, then checked her bp and it was 175/110. Has hx of hypertension.

## 2021-06-02 NOTE — Progress Notes (Signed)
Paged by Dr. Manus Gunning discussed patient being evaluated for dizziness and HTN. Ms. Devries is a 79F with HTN who presented to the ED after her home BP recorded at 175/110.  She is currently on benazepril and has been prescribed HCTZ but self discontinued it for the past few days as she was feeling dizzy.  She was evaluated last month in the ED for similar presentation at that point in time was prescribed metoprolol however she reports that her heart rate dropped in the 40s so her PCP discontinued this and replaced it with HCTZ.  She also had palpitations when she noticed her blood pressure was elevated tonight.  She denies any intoxication.  ECG with inferior Q waves seen on prior comparison but otherwise no ischemic changes.  Appears to be in sinus tach and was similar in September. Dr. Manus Gunning planning on discontinuing HCTZ and replacing with amlodipine 5 mg p.o. daily.  I think this is a reasonable plan for HTN management.  It is not clear why she has moderate sinus tachycardia both now and last month.  She denies any alcohol use or other non prescription medication use.  She may benefit from outpatient continuous cardiac telemetry to assess her heart rate trends over a week and to evaluate for inappropriate sinus tach. Dr. Manus Gunning will provide referral to her for [facility] for OP f/u.

## 2021-06-03 ENCOUNTER — Encounter: Payer: Self-pay | Admitting: Cardiology

## 2021-06-03 ENCOUNTER — Ambulatory Visit (INDEPENDENT_AMBULATORY_CARE_PROVIDER_SITE_OTHER): Payer: BLUE CROSS/BLUE SHIELD | Admitting: Cardiology

## 2021-06-03 VITALS — BP 134/74 | HR 96 | Ht 62.0 in | Wt 163.8 lb

## 2021-06-03 DIAGNOSIS — R002 Palpitations: Secondary | ICD-10-CM | POA: Diagnosis not present

## 2021-06-03 DIAGNOSIS — I10 Essential (primary) hypertension: Secondary | ICD-10-CM

## 2021-06-03 DIAGNOSIS — R9431 Abnormal electrocardiogram [ECG] [EKG]: Secondary | ICD-10-CM | POA: Diagnosis not present

## 2021-06-03 MED ORDER — METOPROLOL SUCCINATE ER 25 MG PO TB24: 12.5000 mg | ORAL_TABLET | Freq: Every day | ORAL | 2 refills | Status: DC

## 2021-06-03 NOTE — Progress Notes (Signed)
Cardiology Office Note  Date: 06/03/2021   ID: Ashlee Brown, DOB [birth year], MRN [id number]  PCP:  Tanna Furry, MD  Cardiologist:  Nona Dell, MD Electrophysiologist:  None   Chief Complaint  Patient presents with   Hypertension     History of Present Illness: Ashlee Brown is a 50 y.o. female referred for cardiology consultation by Dr. Estell Harpin after ER visit in September with elevated blood pressure and abnormal ECG.  She had a recent visit with Dr. Manus Gunning in the ER just yesterday with elevated blood pressure and a sense of palpitations.  She is here today with her husband.  Ms. Savard reports a 25-year history of hypertension, initially on methyldopa, then enalapril, and most recently benazepril which she has tolerated well.  Her initial ER visit back in September was prompted by finding of significantly elevated blood pressure in the absence of symptoms.  She was given prescription for Toprol-XL 50 mg daily to add to baseline benazepril, did not tolerate this beyond about 4 days due to symptomatic bradycardia with heart rate in the 40s.  She was then seen by her PCP who stopped Toprol-XL and started HCTZ in addition to benazepril.  Although this did help keep blood pressure in range, she states that she felt dizzy and washed out, ultimately had to stop HCTZ.  She was doing reasonably well until this Sunday when she again noticed significant elevation in blood pressure, also some dizziness.  She was evaluated in the ER and discharged to start Norvasc 5 mg daily.  They have picked up the prescription but, but not yet started the medication.  There was discussion of palpitations and some of the ER notes.  She does not actually describe any sense of palpitations, but does state that her heart rate was up recently on Sunday when her blood pressure was elevated.  Only other thing out of the ordinary is that she had had an alcoholic beverage that afternoon.  Review her ECG done  recently, outlined below.  She does not report any sudden unexplained syncope, no major exertional limitations reported.   Past Medical History:  Diagnosis Date   Hypertension     Past Surgical History:  Procedure Laterality Date   APPENDECTOMY     CESAREAN SECTION     LEG SURGERY Left     Current Outpatient Medications  Medication Sig Dispense Refill   Barberry-Oreg Grape-Goldenseal (BERBERINE COMPLEX PO) Take 1 tablet by mouth See admin instructions. Take 1 tablet 2 to 3 times aday     benazepril (LOTENSIN) 20 MG tablet Take 20 mg by mouth daily.     Cinnamon 500 MG capsule See admin instructions.     metoprolol succinate (TOPROL XL) 25 MG 24 hr tablet Take 0.5 tablets (12.5 mg total) by mouth daily. 15 tablet 2   pravastatin (PRAVACHOL) 40 MG tablet Take 40 mg by mouth daily.     No current facility-administered medications for this visit.   Allergies:  Escitalopram oxalate, Lisinopril, and Naltrexone-bupropion hcl er   Social History: The patient  reports that she has never smoked. She has never used smokeless tobacco. She reports current alcohol use. She reports that she does not use drugs.   Family History: The patient's family history includes Hyperlipidemia in her mother; Hypertension in her mother.   ROS: No orthopnea or PND.  Physical Exam: VS:  BP 134/74   Pulse 96   Ht 5\' 2"  (1.575 m)   Wt 163 lb 12.8 oz (  74.3 kg)   SpO2 97%   BMI 29.96 kg/m , BMI Body mass index is 29.96 kg/m.  Wt Readings from Last 3 Encounters:  06/03/21 163 lb 12.8 oz (74.3 kg)  06/02/21 165 lb (74.8 kg)  05/14/21 170 lb (77.1 kg)    General: Patient appears comfortable at rest. HEENT: Conjunctiva and lids normal, wearing a mask. Neck: Supple, no elevated JVP or carotid bruits, no thyromegaly. Lungs: Clear to auscultation, nonlabored breathing at rest. Cardiac: Regular rate and rhythm, no S3 or significant systolic murmur, no pericardial rub. Abdomen: Soft, nontender, bowel sounds  present. Extremities: No pitting edema, distal pulses 2+. Skin: Warm and dry. Musculoskeletal: No kyphosis. Neuropsychiatric: Alert and oriented x3, affect grossly appropriate.  ECG:  An ECG dated 06/02/2021 was personally reviewed today and demonstrated:  Sinus tachycardia with low voltage in the limb leads, poor R wave progression, borderline QT prolongation.  Recent Labwork: 06/02/2021: ALT 26; AST 30; BUN 20; Creatinine, Ser 0.88; Hemoglobin 14.8; Magnesium 1.6; Platelets 213; Potassium 3.5; Sodium 138; TSH 3.585   Other Studies Reviewed Today:  Chest x-ray 05/14/2021: FINDINGS: The heart size and mediastinal contours are within normal limits. Both lungs are clear. The visualized skeletal structures are unremarkable.   IMPRESSION: No active disease.  Assessment and Plan:  1.  Essential hypertension, most recently on single drug therapy with benazepril which she has tolerated in general.  She has had some recent elevations in blood pressure, precipitant not entirely clear.  Does not report any definitive sense of palpitations however.  States that baseline heart rate generally runs in the 80s to 90s.  She did not tolerate Toprol-XL at 50 mg daily due to symptomatic bradycardia, did not tolerate HCTZ due to dizziness.  Plan at this time is to continue present dose of benazepril, start back Toprol-XL although at 12.5 mg daily.  I reviewed her recent lab work which is essentially normal.  Check echocardiogram for baseline cardiac structural evaluation.  We will arrange follow-up for further review.  2.  Mixed hyperlipidemia, on Pravachol.  Medication Adjustments/Labs and Tests Ordered: Current medicines are reviewed at length with the patient today.  Concerns regarding medicines are outlined above.   Tests Ordered: Orders Placed This Encounter  Procedures   ECHOCARDIOGRAM COMPLETE     Medication Changes: Meds ordered this encounter  Medications   metoprolol succinate (TOPROL  XL) 25 MG 24 hr tablet    Sig: Take 0.5 tablets (12.5 mg total) by mouth daily.    Dispense:  15 tablet    Refill:  2    06/03/2021 NEW     Disposition:  Follow up  6 weeks.  Signed, Jonelle Sidle, MD, [facility] 06/03/2021 11:36 AM    [facility] at [facility] [address] [place], [place], [place] [address] Phone: [phone]; Fax: [phone]

## 2021-06-03 NOTE — Patient Instructions (Addendum)
Medication Instructions:  Your physician has recommended you make the following change in your medication:  Start metoprolol succinate 12.5 mg daily Continue other medications the same  Labwork: none  Testing/Procedures: Your physician has requested that you have an echocardiogram. Echocardiography is a painless test that uses sound waves to create images of your heart. It provides your doctor with information about the size and shape of your heart and how well your heart's chambers and valves are working. This procedure takes approximately one hour. There are no restrictions for this procedure.  Follow-Up: Your physician recommends that you schedule a follow-up appointment in: 6 weeks  Any Other Special Instructions Will Be Listed Below (If Applicable). Your physician has requested that you regularly monitor and record your blood pressure readings at home twice daily. Please use the same machine at the same time of day to check your readings and record them to bring to your follow-up visit.  If you need a refill on your cardiac medications before your next appointment, please call your pharmacy.

## 2021-06-06 ENCOUNTER — Telehealth: Payer: Self-pay | Admitting: Cardiology

## 2021-06-06 NOTE — Telephone Encounter (Signed)
Spoke with pt who states that she had an episode of palpitations on last night. She recorded BP at that time and noted it to be 170/110 with HR of 120. Patient denies any other symptoms at that time. Pt reports that her BP today is  136/87 HR 60. Please advise.

## 2021-06-06 NOTE — Telephone Encounter (Signed)
Pt c/o medication issue:  1. Name of Medication: metoprolol succinate (TOPROL XL) 25 MG 24 hr tablet   2. How are you currently taking this medication (dosage and times per day)?  1/2 tablet   3. Are you having a reaction (difficulty breathing--STAT)? Yes   4. What is your medication issue? Her BP is staying elevated 170/110 and she is having palpitations   Unavailable from 11a-1230p

## 2021-06-09 NOTE — Telephone Encounter (Signed)
Pt notified and voiced understanding 

## 2021-06-17 ENCOUNTER — Ambulatory Visit (HOSPITAL_COMMUNITY): Payer: BLUE CROSS/BLUE SHIELD | Attending: Internal Medicine

## 2021-06-17 ENCOUNTER — Other Ambulatory Visit: Payer: Self-pay

## 2021-06-17 DIAGNOSIS — R002 Palpitations: Secondary | ICD-10-CM | POA: Diagnosis not present

## 2021-06-17 DIAGNOSIS — R9431 Abnormal electrocardiogram [ECG] [EKG]: Secondary | ICD-10-CM

## 2021-06-17 DIAGNOSIS — I10 Essential (primary) hypertension: Secondary | ICD-10-CM | POA: Diagnosis not present

## 2021-06-17 LAB — ECHOCARDIOGRAM COMPLETE
Area-P 1/2: 2.95 cm2
S' Lateral: 2.7 cm

## 2021-06-18 ENCOUNTER — Telehealth: Payer: Self-pay | Admitting: *Deleted

## 2021-06-18 ENCOUNTER — Ambulatory Visit: Payer: BLUE CROSS/BLUE SHIELD | Admitting: Cardiology

## 2021-06-18 NOTE — Telephone Encounter (Signed)
-----   Message from Jonelle Sidle, MD sent at 06/18/2021  8:10 AM EDT ----- Results reviewed.  LVEF is vigorous at 65 to 70%.  No significant valvular abnormalities.  Continue with current plan and follow-up.

## 2021-06-18 NOTE — Telephone Encounter (Signed)
Patient informed. Copy sent to PCP °

## 2021-07-08 ENCOUNTER — Other Ambulatory Visit (HOSPITAL_COMMUNITY): Payer: BLUE CROSS/BLUE SHIELD

## 2021-07-14 NOTE — Progress Notes (Signed)
Cardiology Office Note  Date: 07/15/2021   ID: Ashlee Brown, DOB [birth year], MRN [id number]  PCP:  Tanna Furry, MD  Cardiologist:  Nona Dell, MD Electrophysiologist:  None   Chief Complaint: 6-week follow-up  History of Present Illness: Ashlee Brown is a 49 y.o. female with a history of hypertension, palpitations, abnormal EKG.  She was recently seen by Dr. Diona Browner after being referred for consultation by Dr. Estell Harpin after ER visit in September with elevated blood pressure and abnormal EKG.  She had recently seen Dr. Manus Gunning for in the ER with elevated blood pressure and sense of palpitation.  Previous history of hypertension on methyldopa, enalapril, benazepril.  Her ER visit was due to elevated blood pressure.  She was given prescription for Toprol-XL 50.  She did not tolerate this medication due to symptomatic bradycardia in the 40s.  PCP stopped Toprol-XL and started HCTZ.  This made her feel dizzy and washed out and she ultimately stopped HCTZ.  The Sunday prior to clinic visit she noticed significant blood pressure elevation with dizziness and was evaluated in the emergency room and started on Norvasc 5 mg.  She had picked up the prescription but had not yet started the medication.  There was mention of palpitations and some of her ER notes.   Plan was to start back on low-dose Toprol-XL at 12.5 mg.  Plan was to check echocardiogram for baseline cardiac structural evaluation.  She was continuing Pravachol.   She is here today for 6-week follow-up.  She had an episode of palpitations in the interim since last visit.  However this appears to have been a one-time event.  She states she has had no further episodes of palpitations.  She brings with her a log of blood pressures which are ranging from the mid 100s to 130 systolic and diastolics of 60s to 70s.  Blood pressure appears to be controlled on current dose of benazepril 20 mg daily and Toprol 12.5 mg p.o. daily.  She  is continuing pravastatin.  We reviewed the results of her echocardiogram which demonstrated EF of 65 to 70%.  No WMA's.  Indeterminate diastolic filling due to E-a fusion.  RV contraction normal, PA pressure is normal, mitral valve normal aortic valve normal.  Blood pressure much improved since starting Toprol 12.5 mg p.o. daily.    Past Medical History:  Diagnosis Date   Hypertension     Past Surgical History:  Procedure Laterality Date   APPENDECTOMY     CESAREAN SECTION     LEG SURGERY Left     Current Outpatient Medications  Medication Sig Dispense Refill   Barberry-Oreg Grape-Goldenseal (BERBERINE COMPLEX PO) Take 1 tablet by mouth See admin instructions. Take 1 tablet 2 to 3 times aday     benazepril (LOTENSIN) 20 MG tablet Take 20 mg by mouth daily.     Cinnamon 500 MG capsule See admin instructions.     metoprolol succinate (TOPROL XL) 25 MG 24 hr tablet Take 0.5 tablets (12.5 mg total) by mouth daily. 15 tablet 2   pravastatin (PRAVACHOL) 40 MG tablet Take 40 mg by mouth daily.     No current facility-administered medications for this visit.   Allergies:  Escitalopram oxalate, Lisinopril, and Naltrexone-bupropion hcl er   Social History: The patient  reports that she has never smoked. She has never used smokeless tobacco. She reports current alcohol use. She reports that she does not use drugs.   Family History: The patient's family history includes  Hyperlipidemia in her mother; Hypertension in her mother.   ROS:  Please see the history of present illness. Otherwise, complete review of systems is positive for none.  All other systems are reviewed and negative.   Physical Exam: VS:  BP (!) 158/102   Pulse 96   Ht 5\' 2"  (1.575 m)   Wt 161 lb 3.2 oz (73.1 kg)   SpO2 97%   BMI 29.48 kg/m , BMI Body mass index is 29.48 kg/m.  Wt Readings from Last 3 Encounters:  07/15/21 161 lb 3.2 oz (73.1 kg)  06/03/21 163 lb 12.8 oz (74.3 kg)  06/02/21 165 lb (74.8 kg)     General: Patient appears comfortable at rest. Neck: Supple, no elevated JVP or carotid bruits, no thyromegaly. Lungs: Clear to auscultation, nonlabored breathing at rest. Cardiac: Regular rate and rhythm, no S3 or significant systolic murmur, no pericardial rub. Extremities: No pitting edema, distal pulses 2+. Skin: Warm and dry. Musculoskeletal: No kyphosis. Neuropsychiatric: Alert and oriented x3, affect grossly appropriate.  ECG:  E[facility]/03/2019 to [facility], ED sinus tachycardia rate of 123, probable old inferior infarct, consider anterior infarct  Recent Labwork: 06/02/2021: ALT 26; AST 30; BUN 20; Creatinine, Ser 0.88; Hemoglobin 14.8; Magnesium 1.6; Platelets 213; Potassium 3.5; Sodium 138; TSH 3.585  No results found for: CHOL, TRIG, HDL, CHOLHDL, VLDL, LDLCALC, LDLDIRECT  Other Studies Reviewed Today:  Echocardiogram 06/17/2021  1. Left ventricular ejection fraction, by estimation, is 65 to 70%. The  left ventricle has normal function. The left ventricle has no regional  wall motion abnormalities. Indeterminate diastolic filling due to E-A  fusion.   2. Right ventricular systolic function is normal. The right ventricular  size is normal. There is normal pulmonary artery systolic pressure.   3. The mitral valve is normal in structure. No evidence of mitral valve  regurgitation.   4. The aortic valve is normal in structure. Aortic valve regurgitation is  not visualized. No aortic stenosis is present.   Comparison(s): No prior Echocardiogram.   Conclusion(s)/Recommendation(s): Normal biventricular function without  evidence of hemodynamically significant valvular heart disease.  Assessment and Plan:  1. Essential hypertension   2. Palpitations    1. Essential hypertension Blood pressure is elevated today but she states she is nervous.  She brings with her a log of blood pressures which are much improved.  BP ranging from the low 110 range to 130 systolic and 60s to  70s diastolic.  Continue benazepril 20 mg p.o. daily, Toprol-XL 12.5 mg p.o. twice daily.  2. Palpitations She had 1 episode of palpitations in the interim since last visit.  She states she has had no further episodes since that time.  Continue Toprol-XL 12.5 mg p.o. daily.  Recent echocardiogram showed no significant abnormalities as noted above.  3.  Hyperlipidemia. Continue pravastatin 40 mg p.o. daily  Medication Adjustments/Labs and Tests Ordered: Current medicines are reviewed at length with the patient today.  Concerns regarding medicines are outlined above.   Disposition: Follow-up with 06/19/2021 or APP 6 months  Signed, Nona Dell, NP 07/15/2021 10:50 AM    [facility] at [facility] [address] [place], [place], [place] Phone: [phone]; Fax: [phone]

## 2021-07-15 ENCOUNTER — Encounter: Payer: Self-pay | Admitting: Family Medicine

## 2021-07-15 ENCOUNTER — Ambulatory Visit (INDEPENDENT_AMBULATORY_CARE_PROVIDER_SITE_OTHER): Payer: BLUE CROSS/BLUE SHIELD | Admitting: Family Medicine

## 2021-07-15 VITALS — BP 158/102 | HR 96 | Ht 62.0 in | Wt 161.2 lb

## 2021-07-15 DIAGNOSIS — R002 Palpitations: Secondary | ICD-10-CM

## 2021-07-15 DIAGNOSIS — I10 Essential (primary) hypertension: Secondary | ICD-10-CM | POA: Diagnosis not present

## 2021-07-15 NOTE — Patient Instructions (Addendum)

## 2021-07-25 ENCOUNTER — Other Ambulatory Visit: Payer: Self-pay | Admitting: Cardiology

## 2021-08-11 ENCOUNTER — Telehealth: Payer: Self-pay | Admitting: Cardiology

## 2021-08-11 NOTE — Telephone Encounter (Signed)
° °  Pt c/o BP issue: STAT if pt c/o blurred vision, one-sided weakness or slurred speech  1. What are your last 5 BP readings? 175/105, daily average mid 150s / mid 90s  2. Are you having any other symptoms (ex. Dizziness, headache, blurred vision, passed out)?   3. What is your BP issue? Pt said when she do some errands like carrying groceries and ADLs her BP goes up to 175/105 and if she sits down for 5-10 mins then her BP will go down to mid 50s / mid 90s. She would like to know if she needs to adjust her meds

## 2021-08-12 NOTE — Telephone Encounter (Signed)
Advised that its normal to have a higher blood pressure with activity Advised to regularly monitor and record your blood pressure readings at home, using the same machine at the same time of day to check your readings and record them. Advised to call office in 1-2 weeks with those readings Verbalized understanding of plan

## 2021-08-12 NOTE — Telephone Encounter (Signed)
Ashlee Brown is returning Ashlee Brown's call. Please advise.

## 2021-11-19 ENCOUNTER — Encounter: Payer: Self-pay | Admitting: Cardiology

## 2021-11-19 ENCOUNTER — Ambulatory Visit (INDEPENDENT_AMBULATORY_CARE_PROVIDER_SITE_OTHER): Payer: BLUE CROSS/BLUE SHIELD

## 2021-11-19 ENCOUNTER — Ambulatory Visit (INDEPENDENT_AMBULATORY_CARE_PROVIDER_SITE_OTHER): Payer: BLUE CROSS/BLUE SHIELD | Admitting: Cardiology

## 2021-11-19 ENCOUNTER — Other Ambulatory Visit: Payer: Self-pay | Admitting: Cardiology

## 2021-11-19 VITALS — BP 162/90 | HR 113 | Ht 62.0 in | Wt 162.4 lb

## 2021-11-19 DIAGNOSIS — R002 Palpitations: Secondary | ICD-10-CM | POA: Diagnosis not present

## 2021-11-19 DIAGNOSIS — I10 Essential (primary) hypertension: Secondary | ICD-10-CM

## 2021-11-19 DIAGNOSIS — E782 Mixed hyperlipidemia: Secondary | ICD-10-CM

## 2021-11-19 NOTE — Progress Notes (Signed)
? ? ?Cardiology Office Note ? ?Date: 11/19/2021  ? ?ID: Ashlee Brown, DOB [birth year], MRN [id number] ? ?PCP:  Tanna Furry, MD  ?Cardiologist:  Nona Dell, MD ?Electrophysiologist:  None  ? ?Chief Complaint  ?Patient presents with  ? Cardiac follow-up  ? ? ?History of Present Illness: ?Ashlee Brown is a 51 y.o. female last seen in November 2022.  She is here for a follow-up visit.  States that she has been taking her medications regularly, in general tolerating the resumption of low-dose Toprol-XL although she occasionally feels somewhat lightheaded.  Over the weekend she was camping with her husband and states that her blood pressure spiked with systolics in the 180s.  Otherwise, she reports reasonable blood pressure control with systolics in the 130s.  Still has a sense of irregular heartbeat at times.  No frank syncope. ? ?Past Medical History:  ?Diagnosis Date  ? Hypertension   ? ? ?Past Surgical History:  ?Procedure Laterality Date  ? APPENDECTOMY    ? CESAREAN SECTION    ? LEG SURGERY Left   ? ? ?Current Outpatient Medications  ?Medication Sig Dispense Refill  ? Barberry-Oreg Grape-Goldenseal (BERBERINE COMPLEX PO) Take 1 tablet by mouth See admin instructions. Take 1 tablet 2 to 3 times aday    ? benazepril (LOTENSIN) 20 MG tablet Take 20 mg by mouth daily.    ? Cinnamon 500 MG capsule See admin instructions.    ? metoprolol succinate (TOPROL-XL) 25 MG 24 hr tablet TAKE (1/2) TABLET BY MOUTH ONCE DAILY. 45 tablet 3  ? pravastatin (PRAVACHOL) 40 MG tablet Take 40 mg by mouth daily.    ? ?No current facility-administered medications for this visit.  ? ?Allergies:  Escitalopram oxalate, Lisinopril, and Naltrexone-bupropion hcl er  ? ?ROS: No orthopnea or PND. ? ?Physical Exam: ?VS:  BP (!) 162/90   Pulse (!) 113 Comment: Pt states she is very nervous.  Ht 5\' 2"  (1.575 m)   Wt 162 lb 6.4 oz (73.7 kg)   SpO2 98%   BMI 29.70 kg/m? , BMI Body mass index is 29.7 kg/m?. ? ?Wt Readings from Last 3  Encounters:  ?11/19/21 162 lb 6.4 oz (73.7 kg)  ?07/15/21 161 lb 3.2 oz (73.1 kg)  ?06/03/21 163 lb 12.8 oz (74.3 kg)  ?  ?General: Patient appears comfortable at rest. ?HEENT: Conjunctiva and lids normal. ?Neck: Supple, no elevated JVP or carotid bruits, no thyromegaly. ?Lungs: Clear to auscultation, nonlabored breathing at rest. ?Cardiac: Regular rate and rhythm, no S3 or significant systolic murmur, no pericardial rub. ?Extremities: No pitting edema. ? ?ECG:  An ECG dated 06/02/2021 was personally reviewed today and demonstrated:  Sinus tachycardia with decreased R wave progression and borderline low voltage. ? ?Recent Labwork: ?06/02/2021: ALT 26; AST 30; BUN 20; Creatinine, Ser 0.88; Hemoglobin 14.8; Magnesium 1.6; Platelets 213; Potassium 3.5; Sodium 138; TSH 3.585  ? ?Other Studies Reviewed Today: ? ?Echocardiogram 06/18/2021: ? 1. Left ventricular ejection fraction, by estimation, is 65 to 70%. The  ?left ventricle has normal function. The left ventricle has no regional  ?wall motion abnormalities. Indeterminate diastolic filling due to E-A  ?fusion.  ? 2. Right ventricular systolic function is normal. The right ventricular  ?size is normal. There is normal pulmonary artery systolic pressure.  ? 3. The mitral valve is normal in structure. No evidence of mitral valve  ?regurgitation.  ? 4. The aortic valve is normal in structure. Aortic valve regurgitation is  ?not visualized. No aortic stenosis is present.  ? ?  Assessment and Plan: ? ?1.  Essential hypertension.  At this point she continues on benazepril and low-dose Toprol-XL.  Still having some intermittent spikes in blood pressure as well as a sense of irregular heartbeat.  She was tachycardic when she came in today but stated that she felt nervous.  Plan is to obtain a 72-hour Zio patch to better assess heart rate variability and rhythm.  She might actually tolerate higher dose beta-blocker.  Further recommendations to follow.  May need to consider  urinary metanephrines and normetanephrines eventually. ? ?2.  Mixed hyperlipidemia, on Pravachol. ? ?Medication Adjustments/Labs and Tests Ordered: ?Current medicines are reviewed at length with the patient today.  Concerns regarding medicines are outlined above.  ? ?Tests Ordered: ?No orders of the defined types were placed in this encounter. ? ? ?Medication Changes: ?No orders of the defined types were placed in this encounter. ? ? ?Disposition:  Follow up  test results. ? ?Signed, ?Jonelle Sidle, MD, [facility] ?11/19/2021 3:41 PM    ?[facility] at [facility] ?[address] [place], [place], [place] [address] ?Phone: [phone]; Fax: [phone]  ?

## 2021-11-19 NOTE — Patient Instructions (Addendum)
Medication Instructions:  ?Your physician recommends that you continue on your current medications as directed. Please refer to the Current Medication list given to you today. ? ?Labwork: ?none ? ?Testing/Procedures: ?ZIO- Long Term Monitor Instructions  ? ?Your physician has requested you wear your ZIO patch monitor 3 days.  ? ?This is a single patch monitor.  [employer] supplies one patch monitor per enrollment.  Additional stickers are not available. ?  ?Please do not apply patch if you will be having a Nuclear Stress Test, Echocardiogram, Cardiac CT, MRI, or Chest Xray during the time frame you would be wearing the monitor. The patch cannot be worn during these tests.  You cannot remove and re-apply the ZIO XT patch monitor. ?  ?  ?Once you have received you monitor, please review enclosed instructions.  Your monitor has already been registered assigning a specific monitor serial # to you. ? ? ?Applying the monitor  ? ?Shave hair from upper left chest. ?  ?Hold abrader disc by orange tab.  Rub abrader in 40 strokes over left upper chest as indicated in your monitor instructions. ?  ?Clean area with 4 enclosed alcohol pads .  Use all pads to assure are is cleaned thoroughly.  Let dry.  ? ?Apply patch as indicated in monitor instructions.  Patch will be place under collarbone on left side of chest with arrow pointing upward. ?  ?Rub patch adhesive wings for 2 minutes.Remove white label marked "1".  Remove white label marked "2".  Rub patch adhesive wings for 2 additional minutes. ?  ?While looking in a mirror, press and release button in center of patch.  A small green light will flash 3-4 times .  This will be your only indicator the monitor has been turned on. ?    ?Do not shower for the first 24 hours.  You may shower after the first 24 hours. ?  ?Press button if you feel a symptom. You will hear a small click.  Record Date, Time and Symptom in the Patient Log Book. ?  ?When you are ready to remove patch, follow  instructions on last 2 pages of Patient Log Book.  Stick patch monitor onto last page of Patient Log Book. ?  ?Place Patient Log Book in Blue box.  Use locking tab on box and tape box closed securely.  The Orange and White box has prepaid postage on it.  Please place in mailbox as soon as possible.  Your physician should have your test results approximately 7 days after the monitor has been mailed back to [employer]. ?  ?Call [employer] Customer Care at [phone] if you have questions regarding your ZIO XT patch monitor.  Call them immediately if you see an orange light blinking on your monitor. ?  ?If your monitor falls off in less than 4 days contact our Monitor department at [phone].  If your monitor becomes loose or falls off after 4 days call [employer] at [phone] for suggestions on securing your monitor. ? ?Follow-Up: ?Your physician recommends that you schedule a follow-up appointment in: pending ? ?Any Other Special Instructions Will Be Listed Below (If Applicable). ? ?If you need a refill on your cardiac medications before your next appointment, please call your pharmacy. ?

## 2021-11-20 DIAGNOSIS — R002 Palpitations: Secondary | ICD-10-CM | POA: Diagnosis not present

## 2022-01-14 ENCOUNTER — Telehealth: Payer: Self-pay | Admitting: Cardiology

## 2022-01-14 MED ORDER — PRAVASTATIN SODIUM 40 MG PO TABS: 40.0000 mg | ORAL_TABLET | Freq: Every day | ORAL | 6 refills | Status: DC

## 2022-01-14 NOTE — Telephone Encounter (Signed)
*  STAT* If patient is at the pharmacy, call can be transferred to refill team.   1. Which medications need to be refilled? (please list name of each medication and dose if known) pravastatin (PRAVACHOL) 40 MG tablet  2. Which pharmacy/location (including street and city if local pharmacy) is medication to be sent to? [facility] - [place], [place] - [address]  3. Do they need a 30 day or 90 day supply? 30  Pt normally gets this medicine filled with her PCP, but was told today that her PCP has left the practice and she is now in search of a new PCP, but was wondering if you could fill this medication until she finds a PCP.    Pt's last dose was yesterday

## 2022-01-19 NOTE — Progress Notes (Unsigned)
Cardiology Office Note  Date: 01/20/2022   ID: Ashlee Brown, DOB [birth year], MRN [id number]  PCP:  Tanna Furry, MD  Cardiologist:  Nona Dell, MD Electrophysiologist:  None   Chief Complaint  Patient presents with   Cardiac follow-up    History of Present Illness: Ashlee Brown is a 51 y.o. female last seen in March.  She is here for a routine visit.  Reports higher blood pressures in the mornings, much better by the afternoon to evening in the range of 120s over 70s.  She does take both her Toprol-XL and her Lotensin in the morning.  Cardiac monitor in April demonstrated sinus rhythm with average heart rate 65 bpm, rare PACs and PVCs.  We talked about a walking plan for exercise.  Past Medical History:  Diagnosis Date   Hypertension     Past Surgical History:  Procedure Laterality Date   APPENDECTOMY     CESAREAN SECTION     LEG SURGERY Left     Current Outpatient Medications  Medication Sig Dispense Refill   Barberry-Oreg Grape-Goldenseal (BERBERINE COMPLEX PO) Take 1 tablet by mouth See admin instructions. Take 1 tablet 2 to 3 times aday     benazepril (LOTENSIN) 20 MG tablet Take 20 mg by mouth daily.     Cinnamon 500 MG capsule See admin instructions.     pravastatin (PRAVACHOL) 40 MG tablet Take 1 tablet (40 mg total) by mouth daily. 30 tablet 6   metoprolol succinate (TOPROL-XL) 25 MG 24 hr tablet Take 1 tablet (25 mg total) by mouth daily. 90 tablet 3   No current facility-administered medications for this visit.   Allergies:  Escitalopram oxalate, Lisinopril, and Naltrexone-bupropion hcl er   ROS: No dizziness or syncope.  Physical Exam: VS:  BP (!) 168/100   Pulse 82   Ht 5\' 2"  (1.575 m)   Wt 167 lb (75.8 kg)   SpO2 100%   BMI 30.54 kg/m , BMI Body mass index is 30.54 kg/m.  Wt Readings from Last 3 Encounters:  01/20/22 167 lb (75.8 kg)  11/19/21 162 lb 6.4 oz (73.7 kg)  07/15/21 161 lb 3.2 oz (73.1 kg)    General: Patient  appears comfortable at rest. HEENT: Conjunctiva and lids normal. Neck: Supple, no elevated JVP or carotid bruits, no thyromegaly. Lungs: Clear to auscultation, nonlabored breathing at rest. Cardiac: Regular rate and rhythm, no S3 or significant systolic murmur. Extremities: No pitting edema.  ECG:  An ECG dated 06/02/2021 was personally reviewed today and demonstrated:  Sinus tachycardia with decreased R wave progression and borderline low voltage  Recent Labwork: 06/02/2021: ALT 26; AST 30; BUN 20; Creatinine, Ser 0.88; Hemoglobin 14.8; Magnesium 1.6; Platelets 213; Potassium 3.5; Sodium 138; TSH 3.585   Other Studies Reviewed Today:  Echocardiogram 06/18/2021:  1. Left ventricular ejection fraction, by estimation, is 65 to 70%. The  left ventricle has normal function. The left ventricle has no regional  wall motion abnormalities. Indeterminate diastolic filling due to E-A  fusion.   2. Right ventricular systolic function is normal. The right ventricular  size is normal. There is normal pulmonary artery systolic pressure.   3. The mitral valve is normal in structure. No evidence of mitral valve  regurgitation.   4. The aortic valve is normal in structure. Aortic valve regurgitation is  not visualized. No aortic stenosis is present.   Cardiac monitor April 2023: ZIO XT reviewed.  2 days, 21 hours analyzed.  Predominant rhythm is sinus with  heart rate ranging from 40 bpm up to 124 bpm and average heart rate 65 bpm.  Rare PACs including atrial couplets were noted representing less than 1% total beats.  There were also rare PVCs representing less than 1% total beats.  No significant arrhythmias or pauses.  Assessment and Plan:  1.  Essential hypertension.  Increase Toprol-XL to 25 mg daily and continue current dose of benazepril.  We discussed a walking plan for exercise as well.  She states that she is going to try and lose some weight.  If we needed to add another agent would likely go  with Aldactone.  She will have routine lab work with PCP later this summer.  2.  Mixed hyperlipidemia on Pravachol.  Medication Adjustments/Labs and Tests Ordered: Current medicines are reviewed at length with the patient today.  Concerns regarding medicines are outlined above.   Tests Ordered: No orders of the defined types were placed in this encounter.   Medication Changes: Meds ordered this encounter  Medications   metoprolol succinate (TOPROL-XL) 25 MG 24 hr tablet    Sig: Take 1 tablet (25 mg total) by mouth daily.    Dispense:  90 tablet    Refill:  3    01/20/2022 dose increase    Disposition:  Follow up  6 months.  Signed, Satira Sark, MD, [facility] 01/20/2022 9:46 AM    [place] at [place], [place],  [address] Phone: [phone]; Fax: [phone]

## 2022-01-20 ENCOUNTER — Encounter: Payer: Self-pay | Admitting: Cardiology

## 2022-01-20 ENCOUNTER — Ambulatory Visit (INDEPENDENT_AMBULATORY_CARE_PROVIDER_SITE_OTHER): Payer: BLUE CROSS/BLUE SHIELD | Admitting: Cardiology

## 2022-01-20 VITALS — BP 168/100 | HR 82 | Ht 62.0 in | Wt 167.0 lb

## 2022-01-20 DIAGNOSIS — E782 Mixed hyperlipidemia: Secondary | ICD-10-CM | POA: Diagnosis not present

## 2022-01-20 DIAGNOSIS — I10 Essential (primary) hypertension: Secondary | ICD-10-CM | POA: Diagnosis not present

## 2022-01-20 MED ORDER — METOPROLOL SUCCINATE ER 25 MG PO TB24: 25.0000 mg | ORAL_TABLET | Freq: Every day | ORAL | 3 refills | Status: DC

## 2022-01-20 NOTE — Patient Instructions (Addendum)
Medication Instructions:  Your physician has recommended you make the following change in your medication:  Increase metoprolol succinate to 25 mg daily Continue other medications the same  Labwork: none  Testing/Procedures: none  Follow-Up: Your physician recommends that you schedule a follow-up appointment in: 6 months  Any Other Special Instructions Will Be Listed Below (If Applicable).  If you need a refill on your cardiac medications before your next appointment, please call your pharmacy. 

## 2022-03-26 ENCOUNTER — Encounter: Payer: Self-pay | Admitting: Cardiology

## 2022-03-26 ENCOUNTER — Other Ambulatory Visit: Payer: Self-pay | Admitting: *Deleted

## 2022-03-26 ENCOUNTER — Other Ambulatory Visit: Payer: Self-pay | Admitting: Student

## 2022-03-26 MED ORDER — BENAZEPRIL HCL 20 MG PO TABS: 20.0000 mg | ORAL_TABLET | Freq: Every day | ORAL | 3 refills | Status: DC

## 2022-06-17 ENCOUNTER — Encounter: Payer: Self-pay | Admitting: Cardiology

## 2022-06-18 ENCOUNTER — Telehealth: Payer: Self-pay

## 2022-06-18 DIAGNOSIS — I10 Essential (primary) hypertension: Secondary | ICD-10-CM

## 2022-06-18 NOTE — Telephone Encounter (Signed)
Patient made aware, states that she has not had any recent labs but will have them done with her PCP. Order for BMET sent to [facility] per patient request.

## 2022-06-18 NOTE — Telephone Encounter (Signed)
Telephone encounter sent to [facility].

## 2022-06-18 NOTE — Telephone Encounter (Signed)
Patient states that her BP is still running high (180/104). Still not having any other symptoms but wants to know if she should go to the ER for evaluation. Please advise.

## 2022-06-18 NOTE — Telephone Encounter (Signed)
Patient made aware. States that she does consume a bit of alcohol during the week and sometimes more on the weekend and wonders could the spike in her BP have something to do with that. States that she is trying to be healthier and has not had anything to drink since Sunday.

## 2022-06-18 NOTE — Telephone Encounter (Signed)
Patient states that over the past 2-3 days, she has been having blood pressure readings that have been around 165/105 with some readings being sometimes a tad bit higher. States that does have an appointment scheduled for December but did not know if she should wait that long. Medications have been reviewed. States that she is ok with possibly adding another medication as mentioned at her last visit. Denies chest pains, dizziness, headaches or SOB. Please advise

## 2022-06-19 LAB — BASIC METABOLIC PANEL
BUN: 12 mg/dL (ref 7–25)
CO2: 24 mmol/L (ref 20–32)
Calcium: 9.8 mg/dL (ref 8.6–10.4)
Chloride: 106 mmol/L (ref 98–110)
Creat: 0.74 mg/dL (ref 0.50–1.03)
Glucose, Bld: 106 mg/dL — ABNORMAL HIGH (ref 65–99)
Potassium: 4.3 mmol/L (ref 3.5–5.3)
Sodium: 141 mmol/L (ref 135–146)

## 2022-06-19 MED ORDER — SPIRONOLACTONE 25 MG PO TABS: 25.0000 mg | ORAL_TABLET | Freq: Every day | ORAL | 0 refills | Status: DC

## 2022-06-19 NOTE — Telephone Encounter (Signed)
-----   Message from Satira Sark, MD sent at 06/19/2022  9:38 AM EDT ----- Results reviewed.  Renal function and potassium are normal.  Suggest adding Aldactone 25 mg daily to her current antihypertensive regimen.  Continue to track blood pressure.  Follow-up BMET in 2 weeks.

## 2022-06-25 ENCOUNTER — Encounter: Payer: Self-pay | Admitting: Cardiology

## 2022-07-06 ENCOUNTER — Encounter: Payer: Self-pay | Admitting: Cardiology

## 2022-07-13 ENCOUNTER — Telehealth: Payer: Self-pay | Admitting: Cardiology

## 2022-07-13 ENCOUNTER — Other Ambulatory Visit: Payer: Self-pay | Admitting: *Deleted

## 2022-07-13 DIAGNOSIS — Z79899 Other long term (current) drug therapy: Secondary | ICD-10-CM

## 2022-07-13 DIAGNOSIS — I10 Essential (primary) hypertension: Secondary | ICD-10-CM

## 2022-07-13 NOTE — Telephone Encounter (Signed)
Bmet order faxed to [phone]

## 2022-07-13 NOTE — Telephone Encounter (Signed)
[facility] is calling in regards to orders they say they have not received. They are requesting a fax for these labs.  Fax # [phone]

## 2022-07-14 ENCOUNTER — Other Ambulatory Visit: Payer: Self-pay | Admitting: Cardiology

## 2022-07-14 ENCOUNTER — Encounter: Payer: Self-pay | Admitting: Cardiology

## 2022-07-14 LAB — BASIC METABOLIC PANEL
BUN: 11 mg/dL (ref 7–25)
CO2: 24 mmol/L (ref 20–32)
Calcium: 9.8 mg/dL (ref 8.6–10.4)
Chloride: 108 mmol/L (ref 98–110)
Creat: 0.83 mg/dL (ref 0.50–1.03)
Glucose, Bld: 106 mg/dL — ABNORMAL HIGH (ref 65–99)
Potassium: 4.6 mmol/L (ref 3.5–5.3)
Sodium: 142 mmol/L (ref 135–146)

## 2022-07-14 MED ORDER — SPIRONOLACTONE 25 MG PO TABS: 25.0000 mg | ORAL_TABLET | Freq: Every day | ORAL | 1 refills | Status: DC

## 2022-07-27 ENCOUNTER — Encounter: Payer: Self-pay | Admitting: Cardiology

## 2022-07-27 ENCOUNTER — Ambulatory Visit: Payer: BLUE CROSS/BLUE SHIELD | Attending: Cardiology | Admitting: Cardiology

## 2022-07-27 VITALS — BP 128/84 | HR 82 | Ht 62.0 in | Wt 156.6 lb

## 2022-07-27 DIAGNOSIS — E782 Mixed hyperlipidemia: Secondary | ICD-10-CM | POA: Diagnosis not present

## 2022-07-27 DIAGNOSIS — I10 Essential (primary) hypertension: Secondary | ICD-10-CM

## 2022-07-27 NOTE — Patient Instructions (Addendum)

## 2022-07-27 NOTE — Progress Notes (Signed)
Cardiology Office Note  Date: 07/27/2022   ID: Ashlee Brown, DOB 1971-07-17, MRN [id number]  PCP:  Coolidge Breeze, FNP  Cardiologist:  Rozann Lesches, MD Electrophysiologist:  None   Chief Complaint  Patient presents with   Cardiac follow-up    History of Present Illness: Ashlee Brown is a 51 y.o. female last seen in May.  She is here for a routine visit.  States that blood pressure has been much better controlled when she checks it at home, today's blood pressure is also well controlled.  I reviewed her medications which are noted below, she has not had any intolerances.  Recent lab work showed normal renal function and potassium.  I personally reviewed her ECG today which shows sinus arrhythmia.  Past Medical History:  Diagnosis Date   Hypertension     Past Surgical History:  Procedure Laterality Date   APPENDECTOMY     CESAREAN SECTION     LEG SURGERY Left     Current Outpatient Medications  Medication Sig Dispense Refill   Barberry-Oreg Grape-Goldenseal (BERBERINE COMPLEX PO) Take 1 tablet by mouth See admin instructions. Take 1 tablet 2 to 3 times aday     benazepril (LOTENSIN) 20 MG tablet Take 1 tablet (20 mg total) by mouth daily. (Patient taking differently: Take 20 mg by mouth 2 (two) times daily.) 30 tablet 3   Cinnamon 500 MG capsule See admin instructions.     metoprolol succinate (TOPROL-XL) 25 MG 24 hr tablet Take 1 tablet (25 mg total) by mouth daily. 90 tablet 3   pravastatin (PRAVACHOL) 40 MG tablet Take 1 tablet (40 mg total) by mouth daily. 30 tablet 6   spironolactone (ALDACTONE) 25 MG tablet Take 1 tablet (25 mg total) by mouth daily. 90 tablet 1   No current facility-administered medications for this visit.   Allergies:  Escitalopram oxalate, Lisinopril, and Naltrexone-bupropion hcl er   ROS: No palpitations or syncope.  Physical Exam: VS:  BP 128/84   Pulse 82   Ht 5\' 2"  (1.575 m)   Wt 156 lb 9.6 oz (71 kg)   SpO2 97%   BMI 28.64  kg/m , BMI Body mass index is 28.64 kg/m.  Wt Readings from Last 3 Encounters:  07/27/22 156 lb 9.6 oz (71 kg)  01/20/22 167 lb (75.8 kg)  11/19/21 162 lb 6.4 oz (73.7 kg)    General: Patient appears comfortable at rest. HEENT: Conjunctiva and lids normal. Neck: Supple, no elevated JVP or carotid bruits. Lungs: Clear to auscultation, nonlabored breathing at rest. Cardiac: Regular rate and rhythm, no S3 or significant systolic murmur.  ECG:  An ECG dated 06/02/2021 was personally reviewed today and demonstrated:  Sinus tachycardia with decreased R wave progression and borderline low voltage.  Recent Labwork: 07/13/2022: BUN 11; Creat 0.83; Potassium 4.6; Sodium 142   Other Studies Reviewed Today:  Echocardiogram 06/18/2021:  1. Left ventricular ejection fraction, by estimation, is 65 to 70%. The  left ventricle has normal function. The left ventricle has no regional  wall motion abnormalities. Indeterminate diastolic filling due to E-A  fusion.   2. Right ventricular systolic function is normal. The right ventricular  size is normal. There is normal pulmonary artery systolic pressure.   3. The mitral valve is normal in structure. No evidence of mitral valve  regurgitation.   4. The aortic valve is normal in structure. Aortic valve regurgitation is  not visualized. No aortic stenosis is present.    Cardiac monitor April 2023:  ZIO XT reviewed.  2 days, 21 hours analyzed.  Predominant rhythm is sinus with heart rate ranging from 40 bpm up to 124 bpm and average heart rate 65 bpm.  Rare PACs including atrial couplets were noted representing less than 1% total beats.  There were also rare PVCs representing less than 1% total beats.  No significant arrhythmias or pauses.  Assessment and Plan:  1.  Essential hypertension, blood pressure is better controlled on current regimen including Aldactone, Toprol-XL, and Lotensin.  She is also working on weight loss.  I reviewed her recent lab  work.  No changes were made today.  Keep follow-up with PCP.  We will see her back in 1 year.  2.  Mixed hyperlipidemia on Pravachol.  Medication Adjustments/Labs and Tests Ordered: Current medicines are reviewed at length with the patient today.  Concerns regarding medicines are outlined above.   Tests Ordered: Orders Placed This Encounter  Procedures   EKG 12-Lead    Medication Changes: No orders of the defined types were placed in this encounter.   Disposition:  Follow up  1 year.  Signed, Jonelle Sidle, MD, [facility] 07/27/2022 10:36 AM    [facility] at [facility] [address] [place], [place], [place] [address] Phone: [phone]; Fax: [phone]

## 2022-09-29 ENCOUNTER — Other Ambulatory Visit: Payer: Self-pay | Admitting: Cardiology

## 2022-11-11 ENCOUNTER — Encounter: Payer: Self-pay | Admitting: Cardiology

## 2023-01-21 ENCOUNTER — Other Ambulatory Visit: Payer: Self-pay | Admitting: Cardiology

## 2023-03-18 IMAGING — DX DG CHEST 1V PORT
1 series · 1 of 1 positions shown · non-contrast
Comparison: None.

CLINICAL DATA: Shortness of breath and high blood pressure

EXAM:
PORTABLE CHEST 1 VIEW

[chest ap]
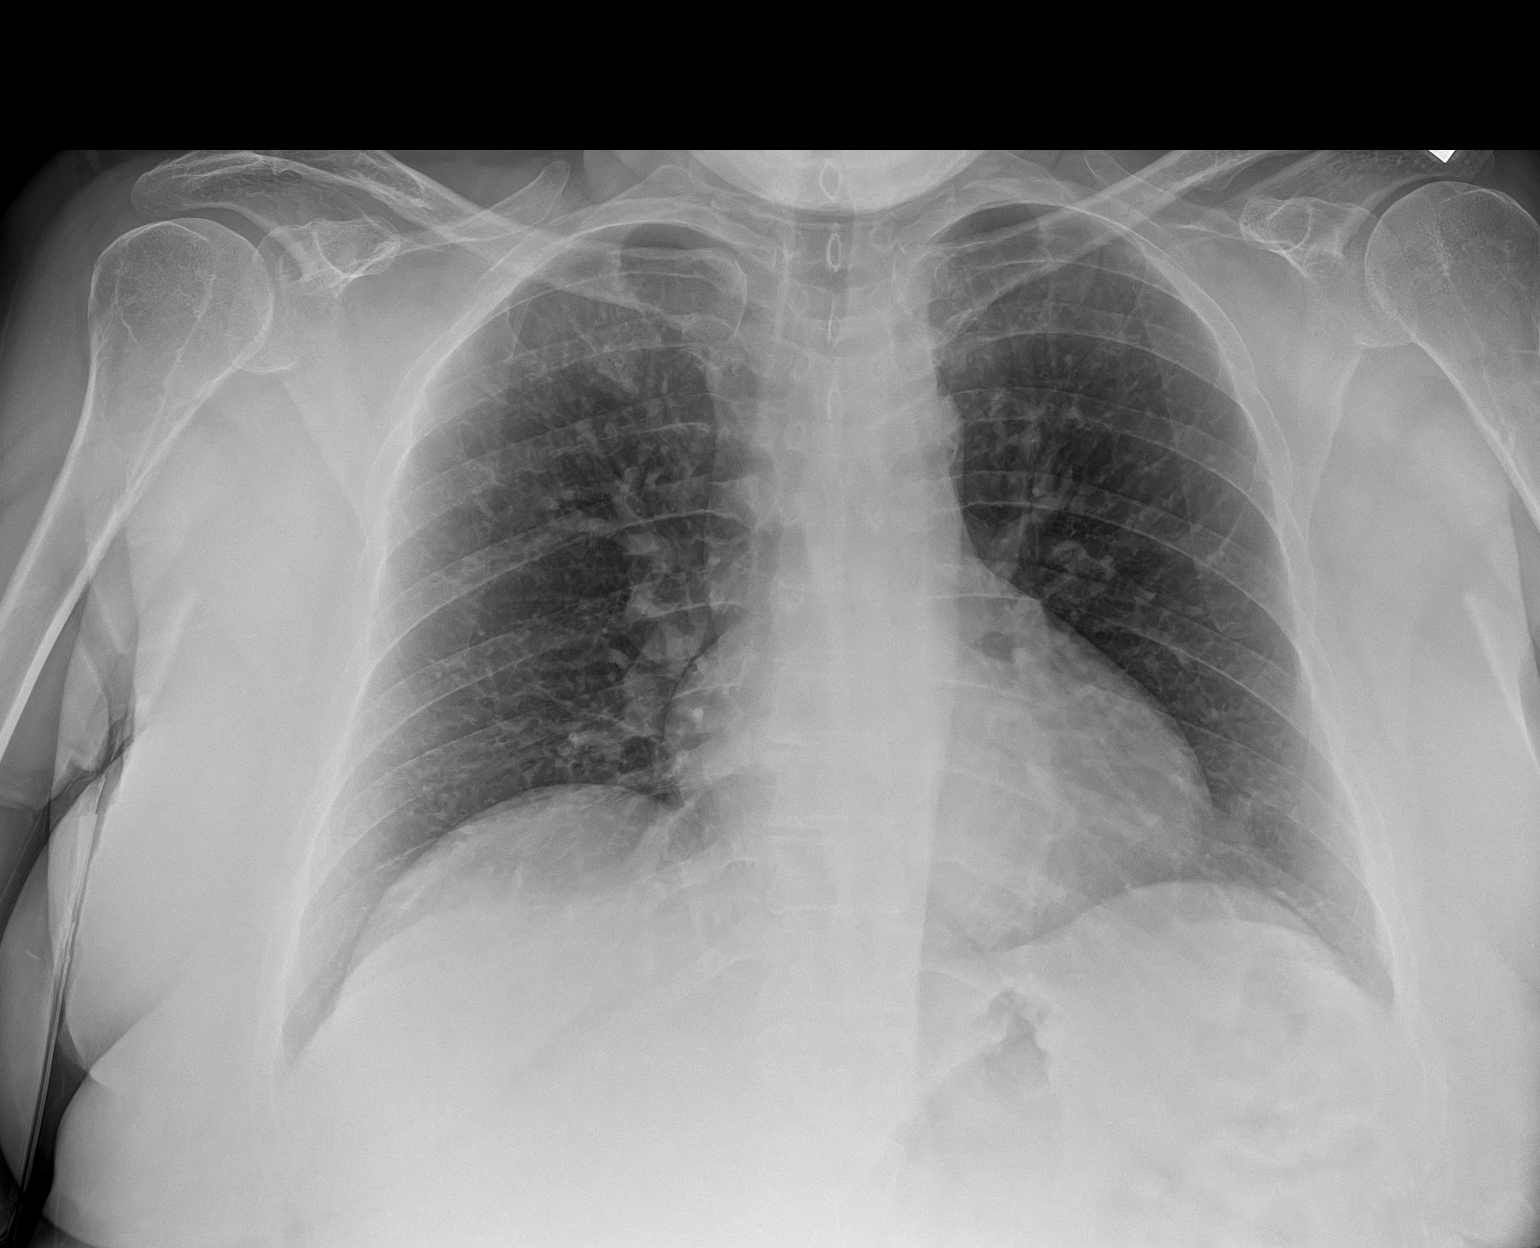

[1 of 1 positions shown; findings below may reference images not displayed]

FINDINGS: The heart size and mediastinal contours are within normal limits.
Both lungs are clear. The visualized skeletal structures are
unremarkable.
IMPRESSION: No active disease.

## 2023-09-01 ENCOUNTER — Encounter: Payer: Self-pay | Admitting: *Deleted

## 2023-09-01 ENCOUNTER — Ambulatory Visit: Payer: BLUE CROSS/BLUE SHIELD | Attending: Cardiology | Admitting: Cardiology

## 2023-09-01 VITALS — BP 128/90 | HR 86 | Ht 62.0 in | Wt 151.6 lb

## 2023-09-01 DIAGNOSIS — I10 Essential (primary) hypertension: Secondary | ICD-10-CM | POA: Diagnosis not present

## 2023-09-01 DIAGNOSIS — E782 Mixed hyperlipidemia: Secondary | ICD-10-CM

## 2023-09-01 NOTE — Patient Instructions (Addendum)
 Medication Instructions:  Your physician recommends that you continue on your current medications as directed. Please refer to the Current Medication list given to you today.  Labwork: none  Testing/Procedures: Your physician has requested that you have a renal artery duplex. During this test, an ultrasound is used to evaluate blood flow to the kidneys. Allow one hour for this exam. Do not eat after midnight the day before and avoid carbonated beverages. Take your medications as you usually do.  Follow-Up: Your physician recommends that you schedule a follow-up appointment in: 1 year. You will receive a reminder call in about 10 months reminding you to schedule your appointment. If you don't receive this call, please contact our office.  Any Other Special Instructions Will Be Listed Below (If Applicable).  If you need a refill on your cardiac medications before your next appointment, please call your pharmacy.

## 2023-09-01 NOTE — Progress Notes (Signed)
    Cardiology Office Note  Date: 09/01/2023   ID: Ashlee Brown, DOB 03/25/1971, MRN [id number]  History of Present Illness: Teiana Hajduk is a 53 y.o. female last seen in December 2023.  She is here for a follow-up visit.  Reports no major health changes other than recent UTI.  It sounds like she may have had an allergic reaction to nitrofurantoin, states that she felt lightheaded and unusual, also blood pressure spiked during this time.  She stopped the medication recently and symptoms are improving.  Plans to contact her PCP.  I reviewed her medications.  Current regimen includes Lotensin , Toprol -XL, Aldactone , and Pravachol .  She reports compliance with treatment.  Blood pressure was elevated when she came in, rechecked by me at 128/90 in the right arm which is still high for her.  She reports systolics generally in the 100-120 range at home.  I reviewed her ECG today which shows sinus rhythm with decreased R wave progression.  Physical Exam: VS:  BP (!) 128/90   Pulse 86   Ht 5' 2 (1.575 m)   Wt 151 lb 9.6 oz (68.8 kg)   SpO2 98%   BMI 27.73 kg/m , BMI Body mass index is 27.73 kg/m.  Wt Readings from Last 3 Encounters:  09/01/23 151 lb 9.6 oz (68.8 kg)  07/27/22 156 lb 9.6 oz (71 kg)  01/20/22 167 lb (75.8 kg)    General: Patient appears comfortable at rest. HEENT: Conjunctiva and lids normal. Neck: Supple, no elevated JVP or carotid bruits. Lungs: Clear to auscultation, nonlabored breathing at rest. Cardiac: Regular rate and rhythm, no S3 or significant systolic murmur, no pericardial rub. Extremities: No pitting edema.  ECG:  An ECG dated 07/27/2022 was personally reviewed today and demonstrated:  Sinus arrhythmia.  Labwork:  November 2023: BUN 11, creatinine 0.83, potassium 4.6  Other Studies Reviewed Today:  No interval cardiac testing for review today.  Assessment and Plan:  1.  Primary hypertension.  Not specifically worked up for secondary causes in the past as  her blood pressure has been controlled in general on current regimen including Toprol -XL, Aldactone , and Lotensin .  Baseline lab work has not shown potassium abnormalities or abnormal renal function.  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  Requesting interval lab work from PCP.  We will arrange renal artery Dopplers to exclude RAS.  Continue to track blood pressure at home.  2.  Mixed hyperlipidemia.  She continues on Pravachol .  Disposition:  Follow up  1 year, sooner if needed.  Signed, Jayson JUDITHANN Sierras, M.D., F.A.C.C. [facility] at [facility]

## 2023-09-07 ENCOUNTER — Encounter: Payer: Self-pay | Admitting: Cardiology

## 2023-09-29 ENCOUNTER — Encounter (HOSPITAL_BASED_OUTPATIENT_CLINIC_OR_DEPARTMENT_OTHER): Payer: BLUE CROSS/BLUE SHIELD

## 2023-10-25 ENCOUNTER — Ambulatory Visit (HOSPITAL_BASED_OUTPATIENT_CLINIC_OR_DEPARTMENT_OTHER): Payer: BLUE CROSS/BLUE SHIELD

## 2023-10-25 DIAGNOSIS — I10 Essential (primary) hypertension: Secondary | ICD-10-CM

## 2023-10-26 ENCOUNTER — Other Ambulatory Visit: Payer: Self-pay | Admitting: Cardiology

## 2023-12-11 DIAGNOSIS — R079 Chest pain, unspecified: Secondary | ICD-10-CM | POA: Insufficient documentation

## 2023-12-11 DIAGNOSIS — I2699 Other pulmonary embolism without acute cor pulmonale: Secondary | ICD-10-CM | POA: Insufficient documentation

## 2023-12-16 ENCOUNTER — Ambulatory Visit: Attending: Cardiology | Admitting: Cardiology

## 2023-12-16 ENCOUNTER — Encounter: Payer: Self-pay | Admitting: Cardiology

## 2023-12-16 VITALS — BP 115/80 | HR 88 | Ht 62.0 in | Wt 155.8 lb

## 2023-12-16 DIAGNOSIS — Z79899 Other long term (current) drug therapy: Secondary | ICD-10-CM | POA: Diagnosis not present

## 2023-12-16 DIAGNOSIS — I10 Essential (primary) hypertension: Secondary | ICD-10-CM

## 2023-12-16 DIAGNOSIS — Z86711 Personal history of pulmonary embolism: Secondary | ICD-10-CM

## 2023-12-16 DIAGNOSIS — E782 Mixed hyperlipidemia: Secondary | ICD-10-CM | POA: Diagnosis not present

## 2023-12-16 MED ORDER — ROSUVASTATIN CALCIUM 20 MG PO TABS: 20.0000 mg | ORAL_TABLET | Freq: Every day | ORAL | 3 refills | Status: DC

## 2023-12-16 NOTE — Progress Notes (Signed)
 Cardiology Office Note  Date: 12/16/2023   ID: Ailis Rigaud, DOB 12-Nov-1970, MRN [id number]  History of Present Illness: Ashlee Brown is a 53 y.o. female last seen in January.  She is here for a follow-up visit.  I reviewed the chart and information that she brought with her today.  She was recently hospitalized at [facility] in [place]  with chest discomfort and a diagnosis of left upper lobe segmental and subsegmental pulmonary embolus by chest CTA.  She tells me that about a week or so prior to her traveling to [facility] with her husband she had been experiencing intermittent chest discomfort to mild degree, also diagnosed with COVID-19 (sinus congestion and chest pain with pleuritic cough).  Her chest discomfort became more intense when she was in [facility] prompting her hospital stay.  Echocardiogram done as part of workup showed LVEF at 55 to 60% and no evidence of RV strain.  She was treated with Eliquis , remains on PE treatment dose through this weekend and then transitioning to 5 mg twice daily.  She states that she feels better, no recurrent chest pain, no cough or hemoptysis.  Reports compliance with her medications.  She does tell me that both her mother and maternal grandmother were diagnosed with pulmonary embolus.  I reviewed her lab work, we also discussed her lipids from December 2024 with recommended adjustments in current regimen.  She continues to track blood pressure at home.  I reviewed her ECG today which shows sinus rhythm with decreased R wave progression.  Physical Exam: VS:  BP 115/80 (BP Location: Right Arm)   Pulse 88   Ht 5\' 2"  (1.575 m)   Wt 155 lb 12.8 oz (70.7 kg)   SpO2 97%   BMI 28.50 kg/m , BMI Body mass index is 28.5 kg/m.  Wt Readings from Last 3 Encounters:  12/16/23 155 lb 12.8 oz (70.7 kg)  09/01/23 151 lb 9.6 oz (68.8 kg)  07/27/22 156 lb 9.6 oz (71 kg)    General: Patient appears comfortable at rest. HEENT:  Conjunctiva and lids normal. Neck: Supple, no elevated JVP or carotid bruits. Lungs: Clear to auscultation, nonlabored breathing at rest. Cardiac: Regular rate and rhythm, no S3 or significant systolic murmur. Extremities: No pitting edema.  ECG:  An ECG dated 09/01/2023 was personally reviewed today and demonstrated:  Sinus rhythm with decreased R wave progression.  Labwork:  December 2024: Cholesterol 245, triglycerides 164, HDL 46, LDL 168, potassium 4.6, GFR 79, BUN 14, creatinine 0.88 April 2025: Potassium 4.1, BUN 12, creatinine 0.60, troponin I 0.332 and 0.435, Hgb 12.4, platelets 250  Other Studies Reviewed Today:  No interval cardiac testing for review today.  Assessment and Plan:  1.  Recent diagnosis of left upper lobe segmental and subsegmental pulmonary embolus.  Question whether related to recent preceding diagnosis of COVID-19, otherwise unprovoked.  She does mention that her mother and maternal grandmother were both diagnosed with pulmonary emboli.  For now continue treatment with Eliquis , plan to refer her to hematology for a thrombophilia workup.  No evidence of RV strain by echocardiogram done at time of workup.  2.  Primary hypertension.  Interval renal artery Dopplers negative for RAS.  Blood pressure rechecked by me today at 115/80.  Continue Lotensin  20 mg daily, Aldactone  25 mg daily, and Toprol -XL 25 mg daily.   2.  Mixed hyperlipidemia.  LDL 168 in December 2024 on Pravachol  40 mg daily.  Switch to Crestor  20 mg  daily (she did have incidentally noted coronary artery calcifications on her chest CTA).  Recheck FLP around the time of next visit.  Disposition:  Follow up  3 to 4 months.  Signed, Gerard Knight, M.D., F.A.C.C. [facility] at [facility]

## 2023-12-16 NOTE — Patient Instructions (Addendum)
 Medication Instructions:  Your physician has recommended you make the following change in your medication:  Stop pravastatin  Start rosuvastatin  20 mg daily Continue all other medications as prescribed  Labwork: Your physician recommends that you return for a FASTING lipid profile in 3-4 months just before your next visit. Please do not eat or drink for at least 8 hours when you have this done. You may take your medications that morning with a sip of water. [facility]   Testing/Procedures: none  Follow-Up: Your physician recommends that you schedule a follow-up appointment in: 3-4 months  Any Other Special Instructions Will Be Listed Below (If Applicable). You have been referred to Hematology  If you need a refill on your cardiac medications before your next appointment, please call your pharmacy.

## 2023-12-29 ENCOUNTER — Inpatient Hospital Stay: Attending: Oncology | Admitting: Oncology

## 2023-12-29 ENCOUNTER — Inpatient Hospital Stay

## 2023-12-29 ENCOUNTER — Encounter: Payer: Self-pay | Admitting: Oncology

## 2023-12-29 VITALS — BP 162/92 | HR 88 | Temp 97.7°F | Resp 18 | Ht 62.0 in | Wt 160.4 lb

## 2023-12-29 DIAGNOSIS — I8290 Acute embolism and thrombosis of unspecified vein: Secondary | ICD-10-CM

## 2023-12-29 DIAGNOSIS — I2699 Other pulmonary embolism without acute cor pulmonale: Secondary | ICD-10-CM

## 2023-12-29 DIAGNOSIS — Z7901 Long term (current) use of anticoagulants: Secondary | ICD-10-CM | POA: Diagnosis not present

## 2023-12-29 NOTE — Patient Instructions (Signed)
 VISIT SUMMARY:  Today, we discussed your recent diagnosis of a blood clot in your lung, which is likely related to your recent COVID-19 infection. We reviewed your treatment plan and discussed the importance of continuing your current medication. We also talked about your past medical history and the need for routine health screenings.  YOUR PLAN:  -PULMONARY EMBOLISM: A pulmonary embolism is a blood clot in the lung that can cause chest pain and shortness of breath. You will continue taking Eliquis for at least three months. We will order a limited thrombophilia panel to check for any underlying clotting disorders and schedule a D-dimer test at the three-month mark to help determine how long you need to stay on the medication. Please do not stop taking Eliquis for any procedures within the first three months.  -GENERAL HEALTH MAINTENANCE: You are behind on some routine health screenings. Please schedule a mammogram and Pap smear. Discuss Cologuard testing with your primary care physician, but avoid scheduling a colonoscopy within the first three months due to your current anticoagulation therapy.  INSTRUCTIONS:  Please schedule a follow-up visit to discuss the results of your D-dimer test and the duration of your anticoagulation therapy. Also, make appointments for a mammogram and Pap smear, and talk to your primary care physician about Cologuard testing.

## 2023-12-29 NOTE — Progress Notes (Signed)
 [facility] at [facility]  HEMATOLOGY NEW VISIT  Twylla Galen, MD  REASON FOR REFERRAL: Venous thromboembolism  SUMMARY OF HEMATOLOGIC HISTORY: Date:12/10/23 1st episode: Pulmonary embolism Risk factors: COVID infection, travel for 5 hours by road, history of mother with blood clots at the age of 38 Cancer screening: Mammogram, Pap smear, colonoscopy: Due at this time Current treatment: Eliquis 5mg  BID   HISTORY OF PRESENT ILLNESS: Ashlee Brown 53 y.o. female referred for pulmonary embolism. She was referred by her cardiologist due to concerns about her age and family history of blood clots.  Approximately three weeks ago, she was diagnosed with a pulmonary embolism after experiencing persistent chest pain and shortness of breath during a trip to [facility]. A CT scan in the emergency department confirmed the presence of a blood clot in her lung, and she was started on Eliquis before being discharged home.  About two and a half weeks prior to the pulmonary embolism diagnosis, she had a COVID-19 infection confirmed by a home test. She did not take any antiviral medications for COVID-19. Her cardiologist suggested a possible link between the recent COVID-19 infection and the development of the blood clot.  Her past medical history includes a blood clot in her leg at the age of [age], discovered during surgery for a bone issue. There was a cyst on top of the clot, and it was suggested that it might have been a hematoma rather than a clot. She has no history of smoking or oral contraceptive use.  Family history is significant for blood clots in her mother, who had a clot at the age of 33, and her paternal grandmother, who had clots in her leg at an older age. There is no family history of breast or ovarian cancer.  She is generally active, engaging in daily activities and wants to return to the gym once it is deemed safe. She lives with her husband and  nearly 3 year old son. She has not had a mammogram or Pap smear in a couple of years and has not yet undergone a colonoscopy or Cologuard testing.   I have reviewed the past medical history, past surgical history, social history and family history with the patient   ALLERGIES:  is allergic to escitalopram oxalate, lisinopril, naltrexone-bupropion hcl er, sulfamethoxazole, and trimethoprim.  MEDICATIONS:  Current Outpatient Medications  Medication Sig Dispense Refill   apixaban (ELIQUIS) 5 MG TABS tablet Take 5 mg by mouth 2 (two) times daily.     Barberry-Oreg Grape-Goldenseal (BERBERINE COMPLEX PO) Take 1 tablet by mouth See admin instructions. Take 1 tablet 2 to 3 times aday     benazepril  (LOTENSIN ) 20 MG tablet Take 1 tablet (20 mg total) by mouth daily. 30 tablet 3   metoprolol  succinate (TOPROL -XL) 25 MG 24 hr tablet TAKE 1 TABLET BY MOUTH ONCE DAILY. 90 tablet 3   rosuvastatin  (CRESTOR ) 20 MG tablet Take 1 tablet (20 mg total) by mouth daily. 90 tablet 3   spironolactone  (ALDACTONE ) 25 MG tablet TAKE ONE TABLET BY MOUTH EVERY DAY 90 tablet 3   No current facility-administered medications for this visit.     REVIEW OF SYSTEMS:   Constitutional: Denies fevers, chills or night sweats Eyes: Denies blurriness of vision Ears, nose, mouth, throat, and face: Denies mucositis or sore throat Respiratory: Denies cough, dyspnea or wheezes Cardiovascular: Denies palpitation, chest discomfort or lower extremity swelling Gastrointestinal:  Denies nausea, heartburn or change in bowel habits Skin: Denies abnormal skin  rashes Lymphatics: Denies new lymphadenopathy or easy bruising Neurological:Denies numbness, tingling or new weaknesses Behavioral/Psych: Mood is stable, no new changes  All other systems were reviewed with the patient and are negative.  PHYSICAL EXAMINATION:   Vitals:   12/29/23 1339  BP: (!) 162/92  Pulse: 88  Resp: 18  Temp: 97.7 F (36.5 C)  SpO2: 100%     GENERAL:alert, no distress and comfortable LYMPH:  no palpable lymphadenopathy in the cervical, axillary or inguinal LUNGS: clear to auscultation and percussion with normal breathing effort HEART: regular rate & rhythm and no murmurs and no lower extremity edema ABDOMEN:abdomen soft, non-tender and normal bowel sounds Musculoskeletal:no cyanosis of digits and no clubbing  NEURO: alert & oriented x 3 with fluent speech  LABORATORY DATA:  I have reviewed the data as listed  Lab Results  Component Value Date   WBC 8.4 06/02/2021   NEUTROABS 5.5 06/02/2021   HGB 14.8 06/02/2021   HCT 42.5 06/02/2021   MCV 95.1 06/02/2021   PLT 213 06/02/2021     Chemistry      Component Value Date/Time   NA 142 07/13/2022 1612   K 4.6 07/13/2022 1612   CL 108 07/13/2022 1612   CO2 24 07/13/2022 1612   BUN 11 07/13/2022 1612   CREATININE 0.83 07/13/2022 1612      Component Value Date/Time   CALCIUM  9.8 07/13/2022 1612   ALKPHOS 98 06/02/2021 0145   AST 30 06/02/2021 0145   ALT 26 06/02/2021 0145   BILITOT 0.8 06/02/2021 0145       RADIOGRAPHIC STUDIES: I have personally reviewed the radiological images as listed and agreed with the findings in the report. None to review  ASSESSMENT & PLAN:  Patient is a 53 y.o. female referred for pulmonary embolism  VTE (venous thromboembolism) 1st episode: Pulmonary embolism Risk factors: COVID infection, travel for 5 hours by road, history of mother with blood clots at the age of 73 Cancer screening: Mammogram, Pap smear, colonoscopy: Due at this time Current treatment: Eliquis 5mg  BID  -Will do thrombophilia workup including anticardiolipin antibodies, antibeta 2 glycoprotein antibodies, factor V Leyden and prothrombin gene mutation testing.  Well-developed to protein C, protein S and lupus anticoagulant testing as patient is on DOAC and these tests will be affected by -Discussed with the patient that although COVID-19 is a high risk factor  for venous thromboembolism, the current strains are not significantly related compared to the prior strains.  This likely seems like an unprovoked clot.  If all the workup comes back negative, we can do a D-dimer based approach where at the end of 3 months we can obtain a D-dimer level and if it is negative can discontinue anticoagulation and repeat the test in 1 month to check the D-dimer level again. -Patient is agreeable with this plan -Recommended patient to obtain age-appropriate cancer screening including Pap smear, mammogram and colonoscopy/Cologuard -Continue Eliquis 5 mg twice daily  Return to clinic in 2 months to discuss results and further management   Orders Placed This Encounter  Procedures   Beta-2 -glycoprotein i abs, IgG/M/A    Standing Status:   Future    Number of Occurrences:   1    Expected Date:   12/29/2023    Expiration Date:   12/28/2024   Prothrombin gene mutation    Standing Status:   Future    Number of Occurrences:   1    Expected Date:   12/29/2023    Expiration Date:  12/28/2024   Cardiolipin antibodies, IgG, IgM, IgA    Standing Status:   Future    Number of Occurrences:   1    Expected Date:   12/29/2023    Expiration Date:   12/28/2024   Factor 5 leiden    Standing Status:   Future    Number of Occurrences:   1    Expected Date:   12/29/2023    Expiration Date:   12/28/2024    The total time spent in the appointment was 60 minutes encounter with patients including review of chart and various tests results, discussions about plan of care and coordination of care plan   All questions were answered. The patient knows to call the clinic with any problems, questions or concerns. No barriers to learning was detected.   Eduardo Grade, MD 5/9/20252:58 PM

## 2023-12-30 LAB — BETA-2-GLYCOPROTEIN I ABS, IGG/M/A
Beta-2 Glyco I IgG: <9 GPI IgG units (ref 0–20)
Beta-2-Glycoprotein I IgA: <9 GPI IgA units (ref 0–25)
Beta-2-Glycoprotein I IgM: <9 GPI IgM units (ref 0–32)

## 2023-12-31 DIAGNOSIS — I8290 Acute embolism and thrombosis of unspecified vein: Secondary | ICD-10-CM | POA: Insufficient documentation

## 2023-12-31 LAB — CARDIOLIPIN ANTIBODIES, IGG, IGM, IGA
Anticardiolipin IgA: <9 U/mL (ref 0–11)
Anticardiolipin IgG: <9 GPL U/mL (ref 0–14)
Anticardiolipin IgM: <9 [MPL'U]/mL (ref 0–12)

## 2023-12-31 NOTE — Assessment & Plan Note (Addendum)
 1st episode: Pulmonary embolism Risk factors: COVID infection, travel for 5 hours by road, history of mother with blood clots at the age of 68 Cancer screening: Mammogram, Pap smear, colonoscopy: Due at this time Current treatment: Eliquis 5mg  BID  -Will do thrombophilia workup including anticardiolipin antibodies, antibeta 2 glycoprotein antibodies, factor V Leyden and prothrombin gene mutation testing.  Well-developed to protein C, protein S and lupus anticoagulant testing as patient is on DOAC and these tests will be affected by -Discussed with the patient that although COVID-19 is a high risk factor for venous thromboembolism, the current strains are not significantly related compared to the prior strains.  This likely seems like an unprovoked clot.  If all the workup comes back negative, we can do a D-dimer based approach where at the end of 3 months we can obtain a D-dimer level and if it is negative can discontinue anticoagulation and repeat the test in 1 month to check the D-dimer level again. -Patient is agreeable with this plan -Recommended patient to obtain age-appropriate cancer screening including Pap smear, mammogram and colonoscopy/Cologuard -Continue Eliquis 5 mg twice daily  Return to clinic in 2 months to discuss results and further management

## 2024-01-03 LAB — FACTOR 5 LEIDEN

## 2024-01-04 ENCOUNTER — Encounter: Payer: Self-pay | Admitting: Cardiology

## 2024-01-04 ENCOUNTER — Other Ambulatory Visit: Payer: Self-pay | Admitting: Cardiology

## 2024-01-04 LAB — PROTHROMBIN GENE MUTATION

## 2024-01-04 MED ORDER — BENAZEPRIL HCL 20 MG PO TABS: 20.0000 mg | ORAL_TABLET | Freq: Every day | ORAL | 3 refills | Status: DC

## 2024-01-07 ENCOUNTER — Encounter: Payer: Self-pay | Admitting: Oncology

## 2024-01-07 ENCOUNTER — Other Ambulatory Visit: Payer: Self-pay | Admitting: Oncology

## 2024-01-07 MED ORDER — APIXABAN 5 MG PO TABS: 5.0000 mg | ORAL_TABLET | Freq: Two times a day (BID) | ORAL | 2 refills | Status: DC

## 2024-01-10 ENCOUNTER — Other Ambulatory Visit: Payer: Self-pay | Admitting: Cardiology

## 2024-01-10 ENCOUNTER — Encounter: Payer: Self-pay | Admitting: Cardiology

## 2024-02-08 ENCOUNTER — Other Ambulatory Visit: Payer: Self-pay

## 2024-02-08 DIAGNOSIS — I2699 Other pulmonary embolism without acute cor pulmonale: Secondary | ICD-10-CM

## 2024-02-09 ENCOUNTER — Inpatient Hospital Stay

## 2024-02-16 ENCOUNTER — Inpatient Hospital Stay: Attending: Oncology | Admitting: Oncology

## 2024-02-16 DIAGNOSIS — I2699 Other pulmonary embolism without acute cor pulmonale: Secondary | ICD-10-CM | POA: Insufficient documentation

## 2024-02-16 DIAGNOSIS — Z79899 Other long term (current) drug therapy: Secondary | ICD-10-CM | POA: Insufficient documentation

## 2024-02-16 DIAGNOSIS — I8290 Acute embolism and thrombosis of unspecified vein: Secondary | ICD-10-CM | POA: Diagnosis not present

## 2024-02-16 DIAGNOSIS — Z7901 Long term (current) use of anticoagulants: Secondary | ICD-10-CM | POA: Diagnosis not present

## 2024-02-16 NOTE — Assessment & Plan Note (Signed)
 1st episode: Pulmonary embolism Risk factors: COVID infection, travel for 5 hours by road, history of mother with blood clots at the age of 47 Cancer screening: Mammogram, Pap smear, colonoscopy: Due at this time Beta-2  cardiolipin antibodies, anticardiolipin antibodies, PT gene mutation, factor V Leiden: Negative Current treatment: Eliquis  5mg  BID  -Discussed with the patient that although COVID-19 is a high risk factor for venous thromboembolism, the current strains are not significantly related compared to the prior strains.  This likely seems like an unprovoked clot.   -Since all the workup is negative, we can do a D-dimer based approach, where at the end of 3 months we can obtain a D-dimer level and if it is negative can discontinue anticoagulation and repeat the test in 1 month to check the D-dimer level again. -Patient is agreeable with this plan -Recommended patient to obtain age-appropriate cancer screening including Pap smear, mammogram and colonoscopy/Cologuard -Continue Eliquis  5 mg twice daily  Return to clinic in 1 month with a D-dimer result.

## 2024-02-16 NOTE — Progress Notes (Signed)
 [facility] at [facility]  Hematology Follow-up Virtual Visit via Telephone Note  Ashlee Aquas, MD  I connected withNAME@ on 02/16/24 at  1:45 PM EDT by telephone and verified that I am speaking with the correct person using two identifiers.  Location: Patient: Ashlee Brown Provider: Mickiel Dry, MD  REASON FOR FOLLOW-UP: Venous thromboembolism  ASSESSMENT & PLAN:  Patient is a 53 y.o. female following for venous thromboembolism  VTE (venous thromboembolism) 1st episode: Pulmonary embolism Risk factors: COVID infection, travel for 5 hours by road, history of mother with blood clots at the age of 12 Cancer screening: Mammogram, Pap smear, colonoscopy: Due at this time Beta-2  cardiolipin antibodies, anticardiolipin antibodies, PT gene mutation, factor V Leiden: Negative Current treatment: Eliquis  5mg  BID  -Discussed with the patient that although COVID-19 is a high risk factor for venous thromboembolism, the current strains are not significantly related compared to the prior strains.  This likely seems like an unprovoked clot.   -Since all the workup is negative, we can do a D-dimer based approach, where at the end of 3 months we can obtain a D-dimer level and if it is negative can discontinue anticoagulation and repeat the test in 1 month to check the D-dimer level again. -Patient is agreeable with this plan -Recommended patient to obtain age-appropriate cancer screening including Pap smear, mammogram and colonoscopy/Cologuard -Continue Eliquis  5 mg twice daily  Return to clinic in 1 month with a D-dimer result.   No orders of the defined types were placed in this encounter.   I discussed the assessment and treatment plan with the patient. The patient was provided an opportunity to ask questions and all were answered. The patient agreed with the plan and demonstrated an understanding of the instructions.   The patient was advised to call back or  seek an in-person evaluation if the symptoms worsen or if the condition fails to improve as anticipated.  I provided 20 minutes of non-face-to-face time during this encounter.   Mickiel Dry, MD 6/25/20253:26 PM    SUMMARY OF HEMATOLOGIC HISTORY: Date:12/10/23 1st episode: Pulmonary embolism Risk factors: COVID infection, travel for 5 hours by road, history of mother with blood clots at the age of 19 Cancer screening: Mammogram, Pap smear, colonoscopy: Due at this time Beta-2  cardiolipin antibodies, anticardiolipin antibodies, PT gene mutation, factor V Leiden: Negative Current treatment: Eliquis  5mg  BID  INTERVAL HISTORY:  I discussed the limitations, risks, security and privacy concerns of performing an evaluation and management service by telephone and the availability of in person appointments. I also discussed with the patient that there may be a patient responsible charge related to this service. The patient expressed understanding and agreed to proceed.   Ashlee Brown 53 y.o. female following for venous thromboembolism.  She has no complaints today.  She is very compliant with her Eliquis  and reports no complications from it.  No increased bleeding.   I have reviewed the past medical history, past surgical history, social history and family history with the patient   ALLERGIES:  is allergic to escitalopram oxalate, lisinopril, naltrexone-bupropion hcl er, sulfamethoxazole, and trimethoprim.  MEDICATIONS:  Current Outpatient Medications  Medication Sig Dispense Refill   apixaban  (ELIQUIS ) 5 MG TABS tablet Take 5 mg by mouth 2 (two) times daily.     apixaban  (ELIQUIS ) 5 MG TABS tablet Take 1 tablet (5 mg total) by mouth 2 (two) times daily. 60 tablet 2   Barberry-Oreg Grape-Goldenseal (BERBERINE COMPLEX PO) Take 1 tablet by  mouth See admin instructions. Take 1 tablet 2 to 3 times aday     benazepril  (LOTENSIN ) 20 MG tablet Take 1 tablet (20 mg total) by mouth daily. 30  tablet 3   metoprolol  succinate (TOPROL -XL) 25 MG 24 hr tablet TAKE ONE TABLET BY MOUTH ONCE DAILY 90 tablet 3   rosuvastatin  (CRESTOR ) 20 MG tablet Take 1 tablet (20 mg total) by mouth daily. 90 tablet 3   spironolactone  (ALDACTONE ) 25 MG tablet TAKE ONE TABLET BY MOUTH EVERY DAY 90 tablet 3   No current facility-administered medications for this visit.     REVIEW OF SYSTEMS:   Constitutional: Denies fevers, chills or night sweats Eyes: Denies blurriness of vision Ears, nose, mouth, throat, and face: Denies mucositis or sore throat Respiratory: Denies cough, dyspnea or wheezes Cardiovascular: Denies palpitation, chest discomfort or lower extremity swelling Gastrointestinal:  Denies nausea, heartburn or change in bowel habits Skin: Denies abnormal skin rashes Lymphatics: Denies new lymphadenopathy or easy bruising Neurological:Denies numbness, tingling or new weaknesses Behavioral/Psych: Mood is stable, no new changes  All other systems were reviewed with the patient and are negative.  PHYSICAL EXAMINATION: Deferred for virtual visit  LABORATORY DATA:  I have reviewed the data as listed  Lab Results  Component Value Date   WBC 8.4 06/02/2021   NEUTROABS 5.5 06/02/2021   HGB 14.8 06/02/2021   HCT 42.5 06/02/2021   MCV 95.1 06/02/2021   PLT 213 06/02/2021      Component Value Date/Time   NA 142 07/13/2022 1612   K 4.6 07/13/2022 1612   CL 108 07/13/2022 1612   CO2 24 07/13/2022 1612   GLUCOSE 106 (H) 07/13/2022 1612   BUN 11 07/13/2022 1612   CREATININE 0.83 07/13/2022 1612   CALCIUM  9.8 07/13/2022 1612   PROT 7.5 06/02/2021 0145   ALBUMIN 4.4 06/02/2021 0145   AST 30 06/02/2021 0145   ALT 26 06/02/2021 0145   ALKPHOS 98 06/02/2021 0145   BILITOT 0.8 06/02/2021 0145   GFRNONAA >60 06/02/2021 0145    Latest Reference Range & Units 12/29/23 14:21  Anticardiolipin Ab,IgA,Qn 0 - 11 APL U/mL <9  Anticardiolipin Ab,IgG,Qn 0 - 14 GPL U/mL <9  Anticardiolipin Ab,IgM,Qn  0 - 12 MPL U/mL <9  Beta-2  Glycoprotein I Ab, IgG 0 - 20 GPI IgG units <9  Beta-2 -Glycoprotein I IgA 0 - 25 GPI IgA units <9  Beta-2 -Glycoprotein I IgM 0 - 32 GPI IgM units <9  Recommendations-F5LEID:  Negative  Recommendations-PTGENE:  Negative     RADIOGRAPHIC STUDIES: I have personally reviewed the radiological images as listed and agreed with the findings in the report. No results found.

## 2024-03-09 ENCOUNTER — Inpatient Hospital Stay: Attending: Oncology

## 2024-03-09 DIAGNOSIS — I2699 Other pulmonary embolism without acute cor pulmonale: Secondary | ICD-10-CM | POA: Diagnosis present

## 2024-03-09 DIAGNOSIS — Z7901 Long term (current) use of anticoagulants: Secondary | ICD-10-CM | POA: Insufficient documentation

## 2024-03-09 DIAGNOSIS — Z79899 Other long term (current) drug therapy: Secondary | ICD-10-CM | POA: Diagnosis not present

## 2024-03-09 LAB — D-DIMER, QUANTITATIVE: D-Dimer, Quant: <0.27 ug{FEU}/mL (ref 0.00–0.50)

## 2024-03-16 ENCOUNTER — Inpatient Hospital Stay (HOSPITAL_BASED_OUTPATIENT_CLINIC_OR_DEPARTMENT_OTHER): Admitting: Oncology

## 2024-03-16 VITALS — BP 120/73 | HR 96 | Temp 98.2°F | Resp 20 | Wt 166.8 lb

## 2024-03-16 DIAGNOSIS — I8290 Acute embolism and thrombosis of unspecified vein: Secondary | ICD-10-CM | POA: Diagnosis not present

## 2024-03-16 DIAGNOSIS — I2699 Other pulmonary embolism without acute cor pulmonale: Secondary | ICD-10-CM | POA: Diagnosis not present

## 2024-03-16 NOTE — Progress Notes (Signed)
 [facility] at [facility]  HEMATOLOGY FOLLOW-UP VISIT  Ashlee Aquas, MD  REASON FOR FOLLOW-UP: Venous thromboembolism   ASSESSMENT & PLAN:  Patient is a 53 y.o. female following for Venous thromboembolism   Assessment & Plan VTE (venous thromboembolism) 1st episode: Pulmonary embolism Risk factors: COVID infection, travel for 5 hours by road, history of mother with blood clots at the age of 69 Cancer screening: Mammogram, Pap smear, colonoscopy: Due at this time Beta-2  cardiolipin antibodies, anticardiolipin antibodies, PT gene mutation, factor V Leiden: Negative Current treatment: Eliquis  5mg  BID Recent D-dimer after completion of Eliquis  for 3 months is negative  -Discussed with the patient that although COVID-19 is a high risk factor for venous thromboembolism, the current strains are not significantly related compared to the prior strains.  This likely seems like an unprovoked clot.   - Will hold Eliquis  at this time and repeat D-dimer in 1 month.  If D-dimer is negative at that time, will recommend discontinuing Eliquis  completely and patient not requiring anticoagulation for lifetime. If D-dimer is positive at that time, will restart Eliquis  and recommend doing Eliquis  2.5 mg for a lifetime.   Return to clinic in 1 month with a D-dimer result.     Orders Placed This Encounter  Procedures   D-dimer, quantitative    Standing Status:   Future    Expected Date:   04/16/2024    Expiration Date:   03/16/2025    The total time spent in the appointment was 20 minutes encounter with patients including review of chart and various tests results, discussions about plan of care and coordination of care plan   All questions were answered. The patient knows to call the clinic with any problems, questions or concerns. No barriers to learning was detected.  Mickiel Dry, MD 7/24/20254:54 PM   SUMMARY OF HEMATOLOGIC HISTORY: Date:12/10/23 1st episode:  Pulmonary embolism Risk factors: COVID infection, travel for 5 hours by road, history of mother with blood clots at the age of 28 Cancer screening: Mammogram, Pap smear, colonoscopy: Due at this time Beta-2  cardiolipin antibodies, anticardiolipin antibodies, PT gene mutation, factor V Leiden: Negative Current treatment: Eliquis  5mg  BID 03/09/2024: D-dimer: Less than 0.27   INTERVAL HISTORY: Ashlee Brown 53 y.o. female following for likely unprovoked VTE.  Patient has no complaints today and is tolerating Eliquis  well.  We discussed that her D-dimer test is negative and that if it is again negative after holding anticoagulation for 1 month, patient will not need lifelong anticoagulation.  Patient is in agreement with this plan.    I have reviewed the past medical history, past surgical history, social history and family history with the patient   ALLERGIES:  is allergic to escitalopram oxalate, lisinopril, naltrexone-bupropion hcl er, sulfamethoxazole, and trimethoprim.  MEDICATIONS:  Current Outpatient Medications  Medication Sig Dispense Refill   apixaban  (ELIQUIS ) 5 MG TABS tablet Take 5 mg by mouth 2 (two) times daily.     apixaban  (ELIQUIS ) 5 MG TABS tablet Take 1 tablet (5 mg total) by mouth 2 (two) times daily. 60 tablet 2   Barberry-Oreg Grape-Goldenseal (BERBERINE COMPLEX PO) Take 1 tablet by mouth See admin instructions. Take 1 tablet 2 to 3 times aday     benazepril  (LOTENSIN ) 20 MG tablet Take 1 tablet (20 mg total) by mouth daily. 30 tablet 3   metoprolol  succinate (TOPROL -XL) 25 MG 24 hr tablet TAKE ONE TABLET BY MOUTH ONCE DAILY 90 tablet 3   rosuvastatin  (CRESTOR ) 20 MG  tablet Take 1 tablet (20 mg total) by mouth daily. 90 tablet 3   spironolactone  (ALDACTONE ) 25 MG tablet TAKE ONE TABLET BY MOUTH EVERY DAY 90 tablet 3   No current facility-administered medications for this visit.     REVIEW OF SYSTEMS:   Constitutional: Denies fevers, chills or night sweats Eyes: Denies  blurriness of vision Ears, nose, mouth, throat, and face: Denies mucositis or sore throat Respiratory: Denies cough, dyspnea or wheezes Cardiovascular: Denies palpitation, chest discomfort or lower extremity swelling Gastrointestinal:  Denies nausea, heartburn or change in bowel habits Skin: Denies abnormal skin rashes Lymphatics: Denies new lymphadenopathy or easy bruising Neurological:Denies numbness, tingling or new weaknesses Behavioral/Psych: Mood is stable, no new changes  All other systems were reviewed with the patient and are negative.  PHYSICAL EXAMINATION:   Vitals:   03/16/24 1418  BP: 120/73  Pulse: 96  Resp: 20  Temp: 98.2 F (36.8 C)  SpO2: 100%    GENERAL:alert, no distress and comfortable SKIN: skin color, texture, turgor are normal, no rashes or significant lesions LUNGS: clear to auscultation and percussion with normal breathing effort HEART: regular rate & rhythm and no murmurs and no lower extremity edema ABDOMEN:abdomen soft, non-tender and normal bowel sounds Musculoskeletal:no cyanosis of digits and no clubbing  NEURO: alert & oriented x 3 with fluent speech  LABORATORY DATA:  I have reviewed the data as listed  Lab Results  Component Value Date   WBC 8.4 06/02/2021   NEUTROABS 5.5 06/02/2021   HGB 14.8 06/02/2021   HCT 42.5 06/02/2021   MCV 95.1 06/02/2021   PLT 213 06/02/2021       Chemistry      Component Value Date/Time   NA 142 07/13/2022 1612   K 4.6 07/13/2022 1612   CL 108 07/13/2022 1612   CO2 24 07/13/2022 1612   BUN 11 07/13/2022 1612   CREATININE 0.83 07/13/2022 1612      Component Value Date/Time   CALCIUM  9.8 07/13/2022 1612   ALKPHOS 98 06/02/2021 0145   AST 30 06/02/2021 0145   ALT 26 06/02/2021 0145   BILITOT 0.8 06/02/2021 0145      Latest Reference Range & Units 03/09/24 13:53  D-Dimer, Quant 0.00 - 0.50 ug/mL-FEU <0.27    RADIOGRAPHIC STUDIES: I have personally reviewed the radiological images as listed  and agreed with the findings in the report.  None to review

## 2024-03-16 NOTE — Assessment & Plan Note (Addendum)
 1st episode: Pulmonary embolism Risk factors: COVID infection, travel for 5 hours by road, history of mother with blood clots at the age of 67 Cancer screening: Mammogram, Pap smear, colonoscopy: Due at this time Beta-2  cardiolipin antibodies, anticardiolipin antibodies, PT gene mutation, factor V Leiden: Negative Current treatment: Eliquis  5mg  BID Recent D-dimer after completion of Eliquis  for 3 months is negative  -Discussed with the patient that although COVID-19 is a high risk factor for venous thromboembolism, the current strains are not significantly related compared to the prior strains.  This likely seems like an unprovoked clot.   - Will hold Eliquis  at this time and repeat D-dimer in 1 month.  If D-dimer is negative at that time, will recommend discontinuing Eliquis  completely and patient not requiring anticoagulation for lifetime. If D-dimer is positive at that time, will restart Eliquis  and recommend doing Eliquis  2.5 mg for a lifetime.   Return to clinic in 1 month with a D-dimer result.

## 2024-04-13 ENCOUNTER — Other Ambulatory Visit

## 2024-04-13 LAB — LIPID PANEL
Cholesterol: 138 mg/dL (ref <200)
HDL: 56 mg/dL (ref >=50)
LDL Cholesterol (Calc): 66 mg/dL
Non-HDL Cholesterol (Calc): 82 mg/dL (ref <130)
Total CHOL/HDL Ratio: 2.5 (calc) (ref <5.0)
Triglycerides: 76 mg/dL (ref <150)

## 2024-04-14 ENCOUNTER — Ambulatory Visit: Payer: Self-pay | Admitting: Cardiology

## 2024-04-19 ENCOUNTER — Ambulatory Visit: Attending: Cardiology | Admitting: Cardiology

## 2024-04-19 ENCOUNTER — Encounter: Payer: Self-pay | Admitting: Cardiology

## 2024-04-19 VITALS — BP 126/72 | HR 76 | Ht 62.0 in | Wt 166.6 lb

## 2024-04-19 DIAGNOSIS — I10 Essential (primary) hypertension: Secondary | ICD-10-CM | POA: Diagnosis not present

## 2024-04-19 DIAGNOSIS — Z86711 Personal history of pulmonary embolism: Secondary | ICD-10-CM | POA: Diagnosis not present

## 2024-04-19 DIAGNOSIS — E782 Mixed hyperlipidemia: Secondary | ICD-10-CM

## 2024-04-19 NOTE — Patient Instructions (Addendum)

## 2024-04-19 NOTE — Progress Notes (Signed)
    Cardiology Office Note  Date: 04/19/2024   ID: Ashlee Brown, DOB September 29, 1970, MRN [id number]  History of Present Illness: Ashlee Brown is a 53 y.o. female last seen in April.  She is here for a routine visit.  I reviewed the chart, she has had interval evaluation by hematology for thrombophilia workup, overall reassuring findings so far.  She has been off Eliquis  the last month and has a follow-up D-dimer tomorrow.  We discussed her medications.  She otherwise has tolerated Crestor  20 mg daily and her follow-up lipid panel looks quite good with LDL 66 and HDL 56.  She does have coronary artery calcifications evident by CT imaging.  Physical Exam: VS:  BP 126/72 (BP Location: Left Arm)   Pulse 76   Ht 5' 2 (1.575 m)   Wt 166 lb 9.6 oz (75.6 kg)   SpO2 98%   BMI 30.47 kg/m , BMI Body mass index is 30.47 kg/m.  Wt Readings from Last 3 Encounters:  04/19/24 166 lb 9.6 oz (75.6 kg)  03/16/24 166 lb 12.8 oz (75.7 kg)  12/29/23 160 lb 6.4 oz (72.8 kg)    General: Patient appears comfortable at rest. HEENT: Conjunctiva and lids normal. Neck: Supple, no elevated JVP or carotid bruits. Lungs: Clear to auscultation, nonlabored breathing at rest. Cardiac: Regular rate and rhythm, no S3 or significant systolic murmur. Extremities: No pitting edema.  ECG:  An ECG dated 12/16/2023 was personally reviewed today and demonstrated:  Sinus rhythm with decreased R wave progression.  Labwork:    Component Value Date/Time   CHOL 138 04/13/2024 0905   TRIG 76 04/13/2024 0905   HDL 56 04/13/2024 0905   CHOLHDL 2.5 04/13/2024 0905   LDLCALC 66 04/13/2024 0905   Other Studies Reviewed Today:  No interval cardiac testing for review today.  Assessment and Plan:  1.  Left upper lobe segmental and subsegmental pulmonary embolus diagnosed in April as discussed in the last note.  No evidence of RV strain by echocardiogram done at time of workup.  She has had interval evaluation by hematology with  overall reassuring thrombophilia workup.  She has been off Eliquis  for the last month with a repeat D-dimer scheduled for tomorrow.  Further anticoagulation plans per hematology at this point.   2.  Primary hypertension.  Blood pressure control is adequate today.  Continue Toprol -XL 25 mg daily, Lotensin  20 mg daily, and Aldactone  25 mg daily.   2.  Mixed hyperlipidemia.  Recent follow-up lab work showed significant improvement in LDL down to 66.  Continue Crestor  20 mg daily.  Disposition:  Follow up 6 months.  Signed, Jayson JUDITHANN Sierras, M.D., F.A.C.C. [facility] at [facility]

## 2024-04-20 ENCOUNTER — Inpatient Hospital Stay

## 2024-04-20 ENCOUNTER — Inpatient Hospital Stay: Attending: Oncology | Admitting: Oncology

## 2024-04-20 VITALS — BP 137/94 | HR 88 | Temp 98.3°F | Resp 20 | Wt 162.9 lb

## 2024-04-20 DIAGNOSIS — I2699 Other pulmonary embolism without acute cor pulmonale: Secondary | ICD-10-CM | POA: Diagnosis present

## 2024-04-20 DIAGNOSIS — I8290 Acute embolism and thrombosis of unspecified vein: Secondary | ICD-10-CM

## 2024-04-20 LAB — D-DIMER, QUANTITATIVE: D-Dimer, Quant: <0.27 ug{FEU}/mL (ref 0.00–0.50)

## 2024-04-20 NOTE — Progress Notes (Signed)
 [name] OFFICE PROGRESS NOTE  Katrinka Aquas, MD  ASSESSMENT & PLAN:    Assessment & Plan VTE (venous thromboembolism) 1st episode: Pulmonary embolism Risk factors: COVID infection, travel for 5 hours by road, history of mother with blood clots at the age of 60 Cancer screening: Mammogram, Pap smear, colonoscopy: Due at this time Beta-2  cardiolipin antibodies, anticardiolipin antibodies, PT gene mutation, factor V Leiden: Negative Current treatment: Eliquis  5mg  BID Recent D-dimer after completion of Eliquis  for 3 months is negative  -Eliquis  has been on hold for 1 month.  Repeat D-dimer on 04/20/2024 is undetectable. -Per Dr. Alethia note, she is okay to stop anticoagulation. -Discussed with the patient that although COVID-19 is a high risk factor for venous thromboembolism, the current strains are not significantly related compared to the prior strains.   Patient may return to PCP.  No further workup needed.   No orders of the defined types were placed in this encounter.   INTERVAL HISTORY: Patient returns for follow-up.  At her last visit, she was instructed to hold Eliquis  for 1 month and she would return for repeat D-dimer.  In the interim, she denies any concerns for recurrent clot.  No shortness of breath or chest pain.  Reports overall doing well.  No hospitalizations, surgeries or changes to her baseline health.  We reviewed D-dimer.  SUMMARY OF HEMATOLOGIC HISTORY: Oncology History   No history exists.   Date:12/10/23 1st episode: Pulmonary embolism Risk factors: COVID infection, travel for 5 hours by road, history of mother with blood clots at the age of 56 Cancer screening: Mammogram, Pap smear, colonoscopy: Due at this time Beta-2  cardiolipin antibodies, anticardiolipin antibodies, PT gene mutation, factor V Leiden: Negative Current treatment: Eliquis  5mg  BID 03/09/2024: D-dimer: Less than 0.27  CBC    Component Value Date/Time   WBC 8.4  06/02/2021 0145   RBC 4.47 06/02/2021 0145   HGB 14.8 06/02/2021 0145   HCT 42.5 06/02/2021 0145   PLT 213 06/02/2021 0145   MCV 95.1 06/02/2021 0145   MCH 33.1 06/02/2021 0145   MCHC 34.8 06/02/2021 0145   RDW 11.2 (L) 06/02/2021 0145   LYMPHSABS 2.4 06/02/2021 0145   MONOABS 0.5 06/02/2021 0145   EOSABS 0.0 06/02/2021 0145   BASOSABS 0.0 06/02/2021 0145       Latest Ref Rng & Units 07/13/2022    4:12 PM 06/18/2022    4:14 PM 06/02/2021    1:45 AM  CMP  Glucose 65 - 99 mg/dL 893  893  872   BUN 7 - 25 mg/dL 11  12  20    Creatinine 0.50 - 1.03 mg/dL 9.16  9.25  9.11   Sodium 135 - 146 mmol/L 142  141  138   Potassium 3.5 - 5.3 mmol/L 4.6  4.3  3.5   Chloride 98 - 110 mmol/L 108  106  105   CO2 20 - 32 mmol/L 24  24  22    Calcium  8.6 - 10.4 mg/dL 9.8  9.8  9.0   Total Protein 6.5 - 8.1 g/dL   7.5   Total Bilirubin 0.3 - 1.2 mg/dL   0.8   Alkaline Phos 38 - 126 U/L   98   AST 15 - 41 U/L   30   ALT 0 - 44 U/L   26      No results found for: FERRITIN, VITAMINB12  Vitals:   04/20/24 1147  BP: (!) 137/94  Pulse: 88  Resp: 20  Temp: 98.3 F (36.8 C)  SpO2: 100%    Review of System:  ROS  Physical Exam: Physical Exam Constitutional:      Appearance: Normal appearance.  HENT:     Head: Normocephalic and atraumatic.  Eyes:     Pupils: Pupils are equal, round, and reactive to light.  Cardiovascular:     Rate and Rhythm: Normal rate and regular rhythm.     Heart sounds: Normal heart sounds. No murmur heard. Pulmonary:     Effort: Pulmonary effort is normal.     Breath sounds: Normal breath sounds. No wheezing.  Abdominal:     General: Bowel sounds are normal. There is no distension.     Palpations: Abdomen is soft.     Tenderness: There is no abdominal tenderness.  Musculoskeletal:        General: Normal range of motion.     Cervical back: Normal range of motion.  Skin:    General: Skin is warm and dry.     Findings: No rash.  Neurological:      Mental Status: She is alert and oriented to person, place, and time.     Gait: Gait is intact.  Psychiatric:        Mood and Affect: Mood and affect normal.        Cognition and Memory: Memory normal.        Judgment: Judgment normal.      I spent 20 minutes dedicated to the care of this patient (face-to-face and non-face-to-face) on the date of the encounter to include what is described in the assessment and plan.,  Delon Hope, NP 04/20/2024 12:24 PM

## 2024-04-20 NOTE — Assessment & Plan Note (Addendum)
 1st episode: Pulmonary embolism Risk factors: COVID infection, travel for 5 hours by road, history of mother with blood clots at the age of 72 Cancer screening: Mammogram, Pap smear, colonoscopy: Due at this time Beta-2  cardiolipin antibodies, anticardiolipin antibodies, PT gene mutation, factor V Leiden: Negative Current treatment: Eliquis  5mg  BID Recent D-dimer after completion of Eliquis  for 3 months is negative  -Eliquis  has been on hold for 1 month.  Repeat D-dimer on 04/20/2024 is undetectable. -Per Dr. Alethia note, she is okay to stop anticoagulation. -Discussed with the patient that although COVID-19 is a high risk factor for venous thromboembolism, the current strains are not significantly related compared to the prior strains.   Patient may return to PCP.  No further workup needed.

## 2024-05-03 ENCOUNTER — Telehealth: Payer: Self-pay | Admitting: Cardiology

## 2024-05-03 NOTE — Telephone Encounter (Signed)
 Spoke with patient regarding her symptoms. She state that since she had taken Augmentin for her ear infection she experienced some chest pains more frequently. Took antibiotic for about 7 days last does was this past Saturday.  She reported that nothing makes it better, but was walking up some steps and noticed it was hurting really bad. She reported that she took some Prilosec a couple of times not sure if it helped or not.  Advised patient that if symptoms worsen can go to the ER to be evaluated, and will route to provider for recommendations. Patient verbalized understanding.

## 2024-05-03 NOTE — Telephone Encounter (Signed)
 Received message from [facility] CHART  - patient states that she is having chest pains off and on. Is requesting an appointment but we don't have anything until October. Spoke with patient and she states that she is uncertain if it could be indigestion. She states that she has never experienced heart burn.

## 2024-05-04 NOTE — Telephone Encounter (Signed)
 Per Dr. Debera response:  If she is having recent onset exertional chest pain that is fairly intense by description, I would suggest that she be seen in the ER rather than waiting on an outpatient visit.   Advised patient of provider response. Patient stated that she was feeling better and that she feels that could have been the antibiotics that she had taking. She will call us  back for an appointment if she needs one sooner. Patient verbalized understanding.

## 2024-05-18 ENCOUNTER — Encounter (HOSPITAL_COMMUNITY): Payer: Self-pay

## 2024-05-18 ENCOUNTER — Inpatient Hospital Stay (HOSPITAL_COMMUNITY)
Admission: EM | Admit: 2024-05-18 | Discharge: 2024-05-22 | DRG: 321 | Disposition: A | Discharge status: Home / Self Care | Attending: Internal Medicine | Admitting: Internal Medicine

## 2024-05-18 ENCOUNTER — Emergency Department (HOSPITAL_COMMUNITY)

## 2024-05-18 ENCOUNTER — Other Ambulatory Visit: Payer: Self-pay

## 2024-05-18 DIAGNOSIS — Z7902 Long term (current) use of antithrombotics/antiplatelets: Secondary | ICD-10-CM

## 2024-05-18 DIAGNOSIS — Z8249 Family history of ischemic heart disease and other diseases of the circulatory system: Secondary | ICD-10-CM | POA: Diagnosis not present

## 2024-05-18 DIAGNOSIS — R200 Anesthesia of skin: Secondary | ICD-10-CM | POA: Diagnosis not present

## 2024-05-18 DIAGNOSIS — I110 Hypertensive heart disease with heart failure: Secondary | ICD-10-CM | POA: Diagnosis present

## 2024-05-18 DIAGNOSIS — I493 Ventricular premature depolarization: Secondary | ICD-10-CM | POA: Diagnosis present

## 2024-05-18 DIAGNOSIS — K219 Gastro-esophageal reflux disease without esophagitis: Secondary | ICD-10-CM | POA: Diagnosis present

## 2024-05-18 DIAGNOSIS — I214 Non-ST elevation (NSTEMI) myocardial infarction: Principal | ICD-10-CM | POA: Diagnosis present

## 2024-05-18 DIAGNOSIS — Z886 Allergy status to analgesic agent status: Secondary | ICD-10-CM

## 2024-05-18 DIAGNOSIS — I63431 Cerebral infarction due to embolism of right posterior cerebral artery: Secondary | ICD-10-CM | POA: Diagnosis not present

## 2024-05-18 DIAGNOSIS — I97820 Postprocedural cerebrovascular infarction during cardiac surgery: Secondary | ICD-10-CM | POA: Diagnosis not present

## 2024-05-18 DIAGNOSIS — M2578 Osteophyte, vertebrae: Secondary | ICD-10-CM | POA: Diagnosis present

## 2024-05-18 DIAGNOSIS — Z955 Presence of coronary angioplasty implant and graft: Secondary | ICD-10-CM

## 2024-05-18 DIAGNOSIS — R29700 NIHSS score 0: Secondary | ICD-10-CM | POA: Diagnosis not present

## 2024-05-18 DIAGNOSIS — Z882 Allergy status to sulfonamides status: Secondary | ICD-10-CM

## 2024-05-18 DIAGNOSIS — E119 Type 2 diabetes mellitus without complications: Secondary | ICD-10-CM

## 2024-05-18 DIAGNOSIS — E1151 Type 2 diabetes mellitus with diabetic peripheral angiopathy without gangrene: Secondary | ICD-10-CM | POA: Diagnosis not present

## 2024-05-18 DIAGNOSIS — Z86711 Personal history of pulmonary embolism: Secondary | ICD-10-CM

## 2024-05-18 DIAGNOSIS — E785 Hyperlipidemia, unspecified: Secondary | ICD-10-CM | POA: Diagnosis present

## 2024-05-18 DIAGNOSIS — Z7982 Long term (current) use of aspirin: Secondary | ICD-10-CM

## 2024-05-18 DIAGNOSIS — I252 Old myocardial infarction: Secondary | ICD-10-CM | POA: Diagnosis not present

## 2024-05-18 DIAGNOSIS — M4802 Spinal stenosis, cervical region: Secondary | ICD-10-CM | POA: Diagnosis present

## 2024-05-18 DIAGNOSIS — M5090 Cervical disc disorder, unspecified, unspecified cervical region: Secondary | ICD-10-CM | POA: Diagnosis present

## 2024-05-18 DIAGNOSIS — I2510 Atherosclerotic heart disease of native coronary artery without angina pectoris: Secondary | ICD-10-CM | POA: Diagnosis present

## 2024-05-18 DIAGNOSIS — E1165 Type 2 diabetes mellitus with hyperglycemia: Secondary | ICD-10-CM | POA: Diagnosis present

## 2024-05-18 DIAGNOSIS — K8020 Calculus of gallbladder without cholecystitis without obstruction: Secondary | ICD-10-CM | POA: Diagnosis present

## 2024-05-18 DIAGNOSIS — I779 Disorder of arteries and arterioles, unspecified: Secondary | ICD-10-CM | POA: Diagnosis not present

## 2024-05-18 DIAGNOSIS — Y840 Cardiac catheterization as the cause of abnormal reaction of the patient, or of later complication, without mention of misadventure at the time of the procedure: Secondary | ICD-10-CM | POA: Diagnosis not present

## 2024-05-18 DIAGNOSIS — I5041 Acute combined systolic (congestive) and diastolic (congestive) heart failure: Secondary | ICD-10-CM | POA: Diagnosis not present

## 2024-05-18 DIAGNOSIS — I255 Ischemic cardiomyopathy: Secondary | ICD-10-CM | POA: Diagnosis present

## 2024-05-18 DIAGNOSIS — Z6829 Body mass index (BMI) 29.0-29.9, adult: Secondary | ICD-10-CM

## 2024-05-18 DIAGNOSIS — I6381 Other cerebral infarction due to occlusion or stenosis of small artery: Secondary | ICD-10-CM | POA: Diagnosis not present

## 2024-05-18 DIAGNOSIS — I25110 Atherosclerotic heart disease of native coronary artery with unstable angina pectoris: Secondary | ICD-10-CM | POA: Diagnosis present

## 2024-05-18 DIAGNOSIS — K760 Fatty (change of) liver, not elsewhere classified: Secondary | ICD-10-CM | POA: Diagnosis present

## 2024-05-18 DIAGNOSIS — E663 Overweight: Secondary | ICD-10-CM | POA: Diagnosis present

## 2024-05-18 DIAGNOSIS — I10 Essential (primary) hypertension: Secondary | ICD-10-CM | POA: Insufficient documentation

## 2024-05-18 DIAGNOSIS — Z8349 Family history of other endocrine, nutritional and metabolic diseases: Secondary | ICD-10-CM

## 2024-05-18 DIAGNOSIS — I5021 Acute systolic (congestive) heart failure: Secondary | ICD-10-CM

## 2024-05-18 DIAGNOSIS — Z79899 Other long term (current) drug therapy: Secondary | ICD-10-CM | POA: Diagnosis not present

## 2024-05-18 DIAGNOSIS — I639 Cerebral infarction, unspecified: Secondary | ICD-10-CM | POA: Insufficient documentation

## 2024-05-18 HISTORY — DX: Hyperlipidemia, unspecified: E78.5

## 2024-05-18 LAB — COMPREHENSIVE METABOLIC PANEL WITH GFR
ALT: 45 U/L — ABNORMAL HIGH (ref 0–44)
AST: 42 U/L — ABNORMAL HIGH (ref 15–41)
Albumin: 4.3 g/dL (ref 3.5–5.0)
Alkaline Phosphatase: 74 U/L (ref 38–126)
Anion gap: 14 (ref 5–15)
BUN: 12 mg/dL (ref 6–20)
CO2: 22 mmol/L (ref 22–32)
Calcium: 9.1 mg/dL (ref 8.9–10.3)
Chloride: 104 mmol/L (ref 98–111)
Creatinine, Ser: 0.77 mg/dL (ref 0.44–1.00)
GFR, Estimated: >60 mL/min (ref >60)
Glucose, Bld: 148 mg/dL — ABNORMAL HIGH (ref 70–99)
Potassium: 3.8 mmol/L (ref 3.5–5.1)
Sodium: 140 mmol/L (ref 135–145)
Total Bilirubin: 0.8 mg/dL (ref 0.0–1.2)
Total Protein: 7.3 g/dL (ref 6.5–8.1)

## 2024-05-18 LAB — CBC WITH DIFFERENTIAL/PLATELET
Abs Immature Granulocytes: 0.02 K/uL (ref 0.00–0.07)
Basophils Absolute: 0 K/uL (ref 0.0–0.1)
Basophils Relative: 0 %
Eosinophils Absolute: 0 K/uL (ref 0.0–0.5)
Eosinophils Relative: 1 %
HCT: 41.9 % (ref 36.0–46.0)
Hemoglobin: 14.5 g/dL (ref 12.0–15.0)
Immature Granulocytes: 0 %
Lymphocytes Relative: 40 %
Lymphs Abs: 3 K/uL (ref 0.7–4.0)
MCH: 33.2 pg (ref 26.0–34.0)
MCHC: 34.6 g/dL (ref 30.0–36.0)
MCV: 95.9 fL (ref 80.0–100.0)
Monocytes Absolute: 0.5 K/uL (ref 0.1–1.0)
Monocytes Relative: 7 %
Neutro Abs: 3.9 K/uL (ref 1.7–7.7)
Neutrophils Relative %: 52 %
Platelets: 237 K/uL (ref 150–400)
RBC: 4.37 MIL/uL (ref 3.87–5.11)
RDW: 11 % — ABNORMAL LOW (ref 11.5–15.5)
WBC: 7.5 K/uL (ref 4.0–10.5)
nRBC: 0 % (ref 0.0–0.2)

## 2024-05-18 LAB — BRAIN NATRIURETIC PEPTIDE: B Natriuretic Peptide: 74 pg/mL (ref 0.0–100.0)

## 2024-05-18 LAB — TROPONIN I (HIGH SENSITIVITY)
Troponin I (High Sensitivity): 112 ng/L (ref <18)
Troponin I (High Sensitivity): 122 ng/L (ref <18)

## 2024-05-18 LAB — APTT: aPTT: 25 s (ref 24–36)

## 2024-05-18 LAB — PROTIME-INR
INR: 0.9 (ref 0.8–1.2)
Prothrombin Time: 12.4 s (ref 11.4–15.2)

## 2024-05-18 LAB — HEPARIN LEVEL (UNFRACTIONATED): Heparin Unfractionated: <0.1 [IU]/mL — ABNORMAL LOW (ref 0.30–0.70)

## 2024-05-18 MED ORDER — SPIRONOLACTONE 25 MG PO TABS: 25.0000 mg | ORAL_TABLET | Freq: Every day | ORAL | Status: DC

## 2024-05-18 MED ORDER — HEPARIN (PORCINE) 25000 UT/250ML-% IV SOLN
900.0000 [IU]/h | INTRAVENOUS | Status: DC
  Administered 2024-05-18: 700 [IU]/h via INTRAVENOUS
  Filled 2024-05-18: qty 250

## 2024-05-18 MED ORDER — METOPROLOL TARTRATE 12.5 MG HALF TABLET
12.5000 mg | ORAL_TABLET | Freq: Two times a day (BID) | ORAL | Status: DC
  Administered 2024-05-18 – 2024-05-20 (×4): 12.5 mg via ORAL
  Filled 2024-05-18 (×4): qty 1

## 2024-05-18 MED ORDER — NITROGLYCERIN 0.4 MG SL SUBL
0.4000 mg | SUBLINGUAL_TABLET | SUBLINGUAL | Status: DC | PRN
  Administered 2024-05-18 (×2): 0.4 mg via SUBLINGUAL
  Filled 2024-05-18 (×2): qty 1

## 2024-05-18 MED ORDER — MELATONIN 3 MG PO TABS: 6.0000 mg | ORAL_TABLET | Freq: Every evening | ORAL | Status: DC | PRN

## 2024-05-18 MED ORDER — PANTOPRAZOLE SODIUM 40 MG PO TBEC
40.0000 mg | DELAYED_RELEASE_TABLET | Freq: Every day | ORAL | Status: DC
  Administered 2024-05-19 – 2024-05-22 (×4): 40 mg via ORAL
  Filled 2024-05-18 (×4): qty 1

## 2024-05-18 MED ORDER — POLYETHYLENE GLYCOL 3350 17 G PO PACK: 17.0000 g | PACK | Freq: Every day | ORAL | Status: DC | PRN

## 2024-05-18 MED ORDER — MORPHINE SULFATE (PF) 4 MG/ML IV SOLN
4.0000 mg | Freq: Once | INTRAVENOUS | Status: DC
  Filled 2024-05-18: qty 1

## 2024-05-18 MED ORDER — ROSUVASTATIN CALCIUM 20 MG PO TABS
20.0000 mg | ORAL_TABLET | Freq: Every day | ORAL | Status: DC
Start: 2024-05-18 — End: 2024-05-21
  Administered 2024-05-18 – 2024-05-21 (×4): 20 mg via ORAL
  Filled 2024-05-18 (×4): qty 1

## 2024-05-18 MED ORDER — ALBUTEROL SULFATE (2.5 MG/3ML) 0.083% IN NEBU: 2.5000 mg | INHALATION_SOLUTION | RESPIRATORY_TRACT | Status: DC | PRN

## 2024-05-18 MED ORDER — ONDANSETRON HCL 4 MG/2ML IJ SOLN: 4.0000 mg | Freq: Four times a day (QID) | INTRAMUSCULAR | Status: DC | PRN

## 2024-05-18 MED ORDER — BENAZEPRIL HCL 20 MG PO TABS
20.0000 mg | ORAL_TABLET | Freq: Every day | ORAL | Status: DC
Start: 2024-05-19 — End: 2024-05-19

## 2024-05-18 MED ORDER — ACETAMINOPHEN 500 MG PO TABS
1000.0000 mg | ORAL_TABLET | Freq: Four times a day (QID) | ORAL | Status: DC | PRN
  Administered 2024-05-18: 1000 mg via ORAL
  Filled 2024-05-18: qty 2

## 2024-05-18 MED ORDER — SODIUM CHLORIDE 0.9% FLUSH
3.0000 mL | Freq: Two times a day (BID) | INTRAVENOUS | Status: DC
  Administered 2024-05-19 – 2024-05-21 (×5): 3 mL via INTRAVENOUS

## 2024-05-18 MED ORDER — HEPARIN BOLUS VIA INFUSION
4000.0000 [IU] | Freq: Once | INTRAVENOUS | Status: AC
  Administered 2024-05-18: 4000 [IU] via INTRAVENOUS

## 2024-05-18 MED ORDER — MORPHINE SULFATE (PF) 2 MG/ML IV SOLN
2.0000 mg | INTRAVENOUS | Status: DC | PRN
Start: 2024-05-18 — End: 2024-05-22

## 2024-05-18 MED ORDER — ASPIRIN 325 MG PO TABS
325.0000 mg | ORAL_TABLET | Freq: Once | ORAL | Status: AC
Start: 2024-05-18 — End: 2024-05-18
  Administered 2024-05-18: 325 mg via ORAL
  Filled 2024-05-18: qty 1

## 2024-05-18 MED ORDER — ASPIRIN 81 MG PO CHEW
81.0000 mg | CHEWABLE_TABLET | Freq: Every day | ORAL | Status: DC
  Administered 2024-05-19: 81 mg via ORAL
  Filled 2024-05-18: qty 1

## 2024-05-18 MED ORDER — SODIUM CHLORIDE 0.9 % IV BOLUS
1000.0000 mL | Freq: Once | INTRAVENOUS | Status: AC
  Administered 2024-05-18: 1000 mL via INTRAVENOUS

## 2024-05-18 MED ORDER — NITROGLYCERIN 2 % TD OINT: 0.5000 [in_us] | TOPICAL_OINTMENT | Freq: Four times a day (QID) | TRANSDERMAL | Status: DC | PRN

## 2024-05-18 MED ORDER — IOHEXOL 350 MG/ML SOLN
75.0000 mL | Freq: Once | INTRAVENOUS | Status: AC | PRN
  Administered 2024-05-18: 75 mL via INTRAVENOUS

## 2024-05-18 MED ORDER — ONDANSETRON HCL 4 MG/2ML IJ SOLN
4.0000 mg | Freq: Once | INTRAMUSCULAR | Status: DC
  Filled 2024-05-18: qty 2

## 2024-05-18 NOTE — ED Notes (Signed)
 Patient transported to CT

## 2024-05-18 NOTE — H&P (Signed)
 History and Physical    Orva Riles FMW:[id number] DOB: [birth year] DOA: 05/18/2024  PCP: Katrinka Aquas, MD   Patient coming from: Home   Chief Complaint:  Chief Complaint  Patient presents with   Chest Pain    HPI:  Ashlee Brown is a 53 y.o. female with hx of CAD by imaging, HTN, HLD, Hx provoked PE, completed 3 months of AC, who presents with chest pain. Reports onset of R parasternal chest pain about 4 weeks ago, described as sharp, lasting for about 5 minutes. Mainly she noted after eating foods/ drinking liquid, noting worse with cold. She thought may be related to esophagitis. However, she has had escalating pattern and severity of pain, especially over the past 4 days. She has begun having exertional component to pain which is relieved with rest. Pain is severe 8/10. Relieved with NTG tab in the ED. She was not taking aspirin  prior to this admission, no hx of MI in the past.     Review of Systems:  ROS complete and negative except as marked above   Allergies  Allergen Reactions   Sulfamethoxazole Other (See Comments)    Blood clots   Trimethoprim Other (See Comments)    Blood clots   Escitalopram Oxalate Other (See Comments)    Unknown    Lisinopril Other (See Comments)    Unknown    Naltrexone-Bupropion Hcl Er Other (See Comments)    Unknown     Prior to Admission medications   Medication Sig Start Date End Date Taking? Authorizing Provider  benazepril  (LOTENSIN ) 20 MG tablet Take 1 tablet (20 mg total) by mouth daily. 01/04/24  Yes Debera Jayson MATSU, MD  esomeprazole (NEXIUM) 20 MG capsule Take 20 mg by mouth daily at 12 noon.   Yes [provider]  metoprolol  succinate (TOPROL -XL) 25 MG 24 hr tablet TAKE ONE TABLET BY MOUTH ONCE DAILY 01/10/24  Yes Debera Jayson MATSU, MD  rosuvastatin  (CRESTOR ) 20 MG tablet Take 1 tablet (20 mg total) by mouth daily. 12/16/23  Yes Debera Jayson MATSU, MD  spironolactone  (ALDACTONE ) 25 MG tablet TAKE ONE TABLET BY MOUTH EVERY  DAY 01/10/24  Yes Debera Jayson MATSU, MD  amoxicillin-clavulanate (AUGMENTIN) 875-125 MG tablet Take 1 tablet by mouth 2 (two) times daily. Patient not taking: Reported on 05/18/2024 04/23/24   [provider]    Past Medical History:  Diagnosis Date   Hyperlipidemia    Hypertension     Past Surgical History:  Procedure Laterality Date   APPENDECTOMY     CESAREAN SECTION     LEG SURGERY Left      reports that she has never smoked. She has never been exposed to tobacco smoke. She has never used smokeless tobacco. She reports current alcohol use. She reports that she does not use drugs.  Family History  Problem Relation Age of Onset   Hypertension Mother    Hyperlipidemia Mother      Physical Exam: Vitals:   05/18/24 2115 05/18/24 2130 05/18/24 2145 05/18/24 2200  BP: (!) 145/90 (!) 150/98 (!) 156/101 (!) 130/98  Pulse: (!) 102 (!) 106 (!) 112 (!) 110  Resp: 17 (!) 24 20 (!) 26  Temp:      TempSrc:      SpO2: 99% 97% 97% 95%  Weight:      Height:        Gen: Awake, alert, NAD   CV: Regular, normal S1, S2, no murmurs  Resp: Normal WOB, CTAB  Abd: Flat, normoactive,  nontender MSK: Symmetric, no edema  Skin: No rashes or lesions to exposed skin  Neuro: Alert and interactive  Psych: euthymic, appropriate    Data review:   Labs reviewed, notable for:   HS trop 112 -> 122  Ast 42, ALT 45, other lft wnl  BG 148     Micro:  No results found for this or any previous visit.  Imaging reviewed:  CT Angio Chest PE W and/or Wo Contrast Result Date: 05/18/2024 CLINICAL DATA:  Pulmonary embolus suspected with high probability. Persistent upper mid chest pain. Pain and burning sensation. EXAM: CT ANGIOGRAPHY CHEST WITH CONTRAST TECHNIQUE: Multidetector CT imaging of the chest was performed using the standard protocol during bolus administration of intravenous contrast. Multiplanar CT image reconstructions and MIPs were obtained to evaluate the vascular anatomy.  RADIATION DOSE REDUCTION: This exam was performed according to the departmental dose-optimization program which includes automated exposure control, adjustment of the mA and/or kV according to patient size and/or use of iterative reconstruction technique. CONTRAST:  75mL OMNIPAQUE  IOHEXOL  350 MG/ML SOLN COMPARISON:  Chest radiograph 05/18/2024 FINDINGS: Cardiovascular: Technically adequate study with good opacification of the central and segmental pulmonary arteries. Mild motion artifact. No focal filling defects are demonstrated. No evidence of significant pulmonary embolus. Normal caliber thoracic aorta. Scattered aortic and coronary artery calcification. No aneurysm or dissection. Heart size is normal. No pericardial effusions. Mediastinum/Nodes: No enlarged mediastinal, hilar, or axillary lymph nodes. Thyroid  gland, trachea, and esophagus demonstrate no significant findings. Lungs/Pleura: Patchy airspace changes in the lung bases may represent dependent atelectasis or edema. Multifocal pneumonia less likely based on appearance. No pleural effusion or pneumothorax. Upper Abdomen: Diffuse fatty infiltration of the liver. Cholelithiasis with large stone in the gallbladder. No acute abnormalities. Musculoskeletal: No chest wall abnormality. No acute or significant osseous findings. Review of the MIP images confirms the above findings. IMPRESSION: 1. No evidence of significant pulmonary embolus. 2. Hazy airspace disease in the lung bases likely represent dependent atelectasis or edema. 3. Fatty infiltration of the liver. 4. Cholelithiasis without evidence of acute cholecystitis. Electronically Signed   By: Elsie Gravely M.D.   On: 05/18/2024 21:10   DG Chest 2 View Result Date: 05/18/2024 CLINICAL DATA:  chest pain EXAM: CHEST - 2 VIEW COMPARISON:  05/14/2021 FINDINGS: No focal airspace consolidation, pleural effusion, or pneumothorax. No cardiomegaly.No acute fracture or destructive lesion. IMPRESSION: No  acute cardiopulmonary abnormality. Electronically Signed   By: Rogelia Myers M.D.   On: 05/18/2024 17:48    EKG:  Personally reviewed ST 111, progression of Q wave inferiorly, new anterolateral q wave, lateral T wave inversion laterally, ST depression inferiorly, 0.5 STE in V2   ED Course:  Loaded with aspirin  325 mg x 1., given 1 L NS, NTG SL.  EDP discussed case with Dr. Cesario, felt OK for care at [facility], recommending for heparin  gtt    Assessment/Plan:  53 y.o. female with hx CAD by imaging, HTN, HLD, Hx provoked PE, completed 3 months of AC, who presents with chest pain and found to have NSTEMI   NSTEMI  Hx chest pain atypical in nature, R sided, sharp. Although progressive pattern + intensity with new onset of exertional pain likely recent unstable angina. HS trop 112 -> 122 at ~ 2.5 hr. EKG with progressive infarct inferiorly, and new q waves anterolaterally, as well as ischemic changes as described above. Considering her progressive EKG change, anticipate she may require invasive evaluation and favor transfer to [facility].  -- EDP had discussed  with Dr. Cesario, felt OK for initial care at [facility]. However, considering her progressive EKG change anticipate she may require invasive evaluation and favor transfer to [facility]; Please call cardiology when arrives at [facility].  -- Keep NPO at [place] in case requires cath  -- s/p aspirin  325 mg load, continue 81 mg daily  -- Continue heparin  gtt, pharmacy to dose  -- NTG paste 0.5 inch q6 hr prn for chest pain; if refractory consider gtt  -- Morphine  2mg  IV q 4 hr prn as second line  -- Split home Metoprolol  to 12.5 mg BID, titrate as able  -- Continue home rosuvastatin  20 mg, additional dose now  -- Check Lipids and A1c  -- Check Echo   Incidental findings:  Hepatic steatosis  Cholelithiasis   Chronic medical problems:  HTN: Continue home Benazepril , metoprolol   HLD: statin per above  Hx provoked PE: Off AC, completed 3 months. CTA neg for PE this admission.    Body mass index is 29.8 kg/m.    DVT prophylaxis:  IV heparin  gtts Code Status:  Full Code Diet:  Diet Orders (From admission, onward)    None      Family Communication:  Yes discussed with husband at bedside   Consults:  Cardiology   Admission status:   Inpatient, Step Down Unit  Severity of Illness: The appropriate patient status for this patient is INPATIENT. Inpatient status is judged to be reasonable and necessary in order to provide the required intensity of service to ensure the patient's safety. The patient's presenting symptoms, physical exam findings, and initial radiographic and laboratory data in the context of their chronic comorbidities is felt to place them at high risk for further clinical deterioration. Furthermore, it is not anticipated that the patient will be medically stable for discharge from the hospital within 2 midnights of admission.   * I certify that at the point of admission it is my clinical judgment that the patient will require inpatient hospital care spanning beyond 2 midnights from the point of admission due to high intensity of service, high risk for further deterioration and high frequency of surveillance required.*   Dorn Dawson, MD [facility]  How to contact the [facility] Attending or Consulting provider 7A - 7P or covering provider during after hours 7P -7A, for this patient.  Check the care team in [facility] and look for a) attending/consulting [facility] provider listed and b) the [facility] team listed Log into [email or web address] and use [place]'s universal password to access. If you do not have the password, please contact the hospital operator. Locate the [facility] provider you are looking for under [facility] and page to a number that you can be directly reached. If you still have difficulty reaching the provider, please page the [facility] (Director on Call) for the Hospitalists listed on amion for assistance.  05/18/2024, 10:14 PM

## 2024-05-18 NOTE — ED Notes (Signed)
 Date and time results received: 05/18/24 744pm (use smartphrase .now to insert current time)  Test: troponin Critical Value: 112  Name of Provider Notified: [place]  Orders Received? Or Actions Taken?: Orders Received - See Orders for details

## 2024-05-18 NOTE — ED Provider Notes (Signed)
 [place] EMERGENCY DEPARTMENT AT [facility] Provider Note   CSN: [id number] Arrival date & time: 05/18/24  1637     Patient presents with: Chest Pain   Ashlee Brown is a 53 y.o. female.   Pt is a 53 yo female with pmhx significant for htn, hld, and hx PE (no longer on thinners).  Pt presents to the ED today with CP that's been intermittent for about a month.  She feels like it's associated with drinking and eating.  No f/c.       Prior to Admission medications   Medication Sig Start Date End Date Taking? Authorizing Provider  benazepril  (LOTENSIN ) 20 MG tablet Take 1 tablet (20 mg total) by mouth daily. 01/04/24  Yes Debera Jayson MATSU, MD  esomeprazole (NEXIUM) 20 MG capsule Take 20 mg by mouth daily at 12 noon.   Yes [provider]  metoprolol  succinate (TOPROL -XL) 25 MG 24 hr tablet TAKE ONE TABLET BY MOUTH ONCE DAILY 01/10/24  Yes Debera Jayson MATSU, MD  rosuvastatin  (CRESTOR ) 20 MG tablet Take 1 tablet (20 mg total) by mouth daily. 12/16/23  Yes Debera Jayson MATSU, MD  spironolactone  (ALDACTONE ) 25 MG tablet TAKE ONE TABLET BY MOUTH EVERY DAY 01/10/24  Yes Debera Jayson MATSU, MD  amoxicillin-clavulanate (AUGMENTIN) 875-125 MG tablet Take 1 tablet by mouth 2 (two) times daily. Patient not taking: Reported on 05/18/2024 04/23/24   [provider]    Allergies: Sulfamethoxazole, Trimethoprim, Escitalopram oxalate, Lisinopril, and Naltrexone-bupropion hcl er    Review of Systems  Cardiovascular:  Positive for chest pain.  All other systems reviewed and are negative.   Updated Vital Signs BP (!) 156/101   Pulse (!) 112   Temp 97.7 F (36.5 C) (Temporal)   Resp 20   Ht 5' 2 (1.575 m)   Wt 73.9 kg   SpO2 97%   BMI 29.80 kg/m   Physical Exam Vitals and nursing note reviewed.  Constitutional:      Appearance: She is well-developed.  HENT:     Head: Normocephalic and atraumatic.  Eyes:     Extraocular Movements: Extraocular movements  intact.     Pupils: Pupils are equal, round, and reactive to light.  Cardiovascular:     Rate and Rhythm: Regular rhythm. Tachycardia present.     Heart sounds: Normal heart sounds.  Pulmonary:     Effort: Pulmonary effort is normal.     Breath sounds: Normal breath sounds.  Abdominal:     General: Bowel sounds are normal.     Palpations: Abdomen is soft.  Musculoskeletal:        General: Normal range of motion.     Cervical back: Normal range of motion and neck supple.  Skin:    General: Skin is warm.     Capillary Refill: Capillary refill takes less than 2 seconds.  Neurological:     General: No focal deficit present.     Mental Status: She is alert and oriented to person, place, and time.  Psychiatric:        Mood and Affect: Mood normal.        Behavior: Behavior normal.     (all labs ordered are listed, but only abnormal results are displayed) Labs Reviewed  CBC WITH DIFFERENTIAL/PLATELET - Abnormal; Notable for the following components:      Result Value   RDW 11.0 (*)    All other components within normal limits  COMPREHENSIVE METABOLIC PANEL WITH GFR - Abnormal; Notable for  the following components:   Glucose, Bld 148 (*)    AST 42 (*)    ALT 45 (*)    All other components within normal limits  TROPONIN I (HIGH SENSITIVITY) - Abnormal; Notable for the following components:   Troponin I (High Sensitivity) 112 (*)    All other components within normal limits  TROPONIN I (HIGH SENSITIVITY) - Abnormal; Notable for the following components:   Troponin I (High Sensitivity) 122 (*)    All other components within normal limits  HEPARIN  LEVEL (UNFRACTIONATED)  APTT  PROTIME-INR  HEPARIN  LEVEL (UNFRACTIONATED)  APTT  CBC    EKG: EKG Interpretation Date/Time:  Thursday May 18 2024 16:49:55 EDT Ventricular Rate:  111 PR Interval:  144 QRS Duration:  66 QT Interval:  346 QTC Calculation: 470 R Axis:   -18  Text Interpretation: Sinus tachycardia Inferior  infarct , age undetermined Anterolateral infarct (cited on or before 18-May-2024) Abnormal ECG When compared with ECG of 16-Dec-2023 14:08, Inferior infarct is now Present Questionable change in initial forces of Lateral leads Nonspecific T wave abnormality no longer evident in Anterior leads Since last tracing rate faster Confirmed by Dean Clarity [phone]) on 05/18/2024 7:59:05 PM  Radiology: CT Angio Chest PE W and/or Wo Contrast Result Date: 05/18/2024 CLINICAL DATA:  Pulmonary embolus suspected with high probability. Persistent upper mid chest pain. Pain and burning sensation. EXAM: CT ANGIOGRAPHY CHEST WITH CONTRAST TECHNIQUE: Multidetector CT imaging of the chest was performed using the standard protocol during bolus administration of intravenous contrast. Multiplanar CT image reconstructions and MIPs were obtained to evaluate the vascular anatomy. RADIATION DOSE REDUCTION: This exam was performed according to the departmental dose-optimization program which includes automated exposure control, adjustment of the mA and/or kV according to patient size and/or use of iterative reconstruction technique. CONTRAST:  75mL OMNIPAQUE  IOHEXOL  350 MG/ML SOLN COMPARISON:  Chest radiograph 05/18/2024 FINDINGS: Cardiovascular: Technically adequate study with good opacification of the central and segmental pulmonary arteries. Mild motion artifact. No focal filling defects are demonstrated. No evidence of significant pulmonary embolus. Normal caliber thoracic aorta. Scattered aortic and coronary artery calcification. No aneurysm or dissection. Heart size is normal. No pericardial effusions. Mediastinum/Nodes: No enlarged mediastinal, hilar, or axillary lymph nodes. Thyroid  gland, trachea, and esophagus demonstrate no significant findings. Lungs/Pleura: Patchy airspace changes in the lung bases may represent dependent atelectasis or edema. Multifocal pneumonia less likely based on appearance. No pleural effusion or  pneumothorax. Upper Abdomen: Diffuse fatty infiltration of the liver. Cholelithiasis with large stone in the gallbladder. No acute abnormalities. Musculoskeletal: No chest wall abnormality. No acute or significant osseous findings. Review of the MIP images confirms the above findings. IMPRESSION: 1. No evidence of significant pulmonary embolus. 2. Hazy airspace disease in the lung bases likely represent dependent atelectasis or edema. 3. Fatty infiltration of the liver. 4. Cholelithiasis without evidence of acute cholecystitis. Electronically Signed   By: Elsie Gravely M.D.   On: 05/18/2024 21:10   DG Chest 2 View Result Date: 05/18/2024 CLINICAL DATA:  chest pain EXAM: CHEST - 2 VIEW COMPARISON:  05/14/2021 FINDINGS: No focal airspace consolidation, pleural effusion, or pneumothorax. No cardiomegaly.No acute fracture or destructive lesion. IMPRESSION: No acute cardiopulmonary abnormality. Electronically Signed   By: Rogelia Myers M.D.   On: 05/18/2024 17:48     Procedures   Medications Ordered in the ED  morphine  (PF) 4 MG/ML injection 4 mg (0 mg Intravenous Hold 05/18/24 2125)  ondansetron  (ZOFRAN ) injection 4 mg (0 mg Intravenous Hold 05/18/24 2125)  nitroGLYCERIN  (NITROSTAT ) SL tablet 0.4 mg (0.4 mg Sublingual Given 05/18/24 2145)  heparin  bolus via infusion 4,000 Units (has no administration in time range)  heparin  ADULT infusion 100 units/mL (25000 units/250mL) (has no administration in time range)  iohexol  (OMNIPAQUE ) 350 MG/ML injection 75 mL (75 mLs Intravenous Contrast Given 05/18/24 2053)  sodium chloride  0.9 % bolus 1,000 mL (1,000 mLs Intravenous New Bag/Given 05/18/24 2115)  aspirin  tablet 325 mg (325 mg Oral Given 05/18/24 2145)                                    Medical Decision Making Amount and/or Complexity of Data Reviewed Labs: ordered. Radiology: ordered.  Risk OTC drugs. Prescription drug management. Decision regarding hospitalization.   This patient presents  to the ED for concern of cp, this involves an extensive number of treatment options, and is a complaint that carries with it a high risk of complications and morbidity.  The differential diagnosis includes cardiac, pulm, gi, msk   Co morbidities that complicate the patient evaluation  htn, hld, and hx PE (no longer on thinners)   Additional history obtained:  Additional history obtained from epic chart review External records from outside source obtained and reviewed including husband   Lab Tests:  I Ordered, and personally interpreted labs.  The pertinent results include:  cbc nl, cmp nl, trop elevated at 112; 2nd trop up to 122   Imaging Studies ordered:  I ordered imaging studies including cxr and ct chest  I independently visualized and interpreted imaging which showed  CXR: No acute cardiopulmonary abnormality.  CT chest:  No evidence of significant pulmonary embolus.  2. Hazy airspace disease in the lung bases likely represent  dependent atelectasis or edema.  3. Fatty infiltration of the liver.  4. Cholelithiasis without evidence of acute cholecystitis.   I agree with the radiologist interpretation   Cardiac Monitoring:  The patient was maintained on a cardiac monitor.  I personally viewed and interpreted the cardiac monitored which showed an underlying rhythm of: st   Medicines ordered and prescription drug management:  I ordered medication including morphine /zofran lamona  for sx  Reevaluation of the patient after these medicines showed that the patient improved I have reviewed the patients home medicines and have made adjustments as needed   Test Considered:  ct  Consultations Obtained:  I requested consultation with the cardiologist (Dr. Cesario),  and discussed lab and imaging findings as well as pertinent plan -she thought pt could stay here.  She will message the [facility] team to see pt in the am. Pt d/w Dr. Keturah ([facility]) for admission   Problem List / ED  Course:  NSTEMI:  pt's trops are climbing.  Ct neg for PE.  Pt given asa/nitroglycerin /heparin .   Reevaluation:  After the interventions noted above, I reevaluated the patient and found that they have :improved   Social Determinants of Health:  Lives at home   Dispostion:  After consideration of the diagnostic results and the patients response to treatment, I feel that the patent would benefit from admission.       Final diagnoses:  NSTEMI (non-ST elevated myocardial infarction) [facility])    ED Discharge Orders     None          Dean Clarity, MD 05/18/24 2211

## 2024-05-18 NOTE — ED Triage Notes (Signed)
 Pt arrived via POV c/o persistent upper mid chest pain.Pt reports pain has a burning sensation. Pt began after Pt began a treatment of Augmentin earlier this month and has not gone away.

## 2024-05-18 NOTE — Consult Note (Signed)
 PHARMACY - ANTICOAGULATION CONSULT NOTE  Pharmacy Consult for Heparin  Indication: chest pain/ACS  Allergies  Allergen Reactions   Sulfamethoxazole Other (See Comments)    Blood clots   Trimethoprim Other (See Comments)    Blood clots   Escitalopram Oxalate Other (See Comments)    Unknown    Lisinopril Other (See Comments)    Unknown    Naltrexone-Bupropion Hcl Er Other (See Comments)    Unknown     Patient Measurements: Height: 5' 2 (157.5 cm) Weight: 73.9 kg (162 lb 14.7 oz) IBW/kg (Calculated) : 50.1 HEPARIN  DW (KG): 66  Vital Signs: Temp: 97.7 F (36.5 C) (09/25 1646) Temp Source: Temporal (09/25 1646) BP: 156/101 (09/25 2145) Pulse Rate: 112 (09/25 2145)  Labs: Recent Labs    05/18/24 1805 05/18/24 2038  HGB 14.5  --   HCT 41.9  --   PLT 237  --   CREATININE 0.77  --   TROPONINIHS 112* 122*    Estimated Creatinine Clearance: 76.5 mL/min (by C-G formula based on SCr of 0.77 mg/dL).   Medical History: Past Medical History:  Diagnosis Date   Hyperlipidemia    Hypertension     Medications:  (Not in a hospital admission)  Scheduled:    morphine  injection  4 mg Intravenous Once   ondansetron  (ZOFRAN ) IV  4 mg Intravenous Once   Infusions:  PRN: nitroGLYCERIN  Anti-infectives (From admission, onward)    None       Assessment: 53 year old female presented with upper mid chest pain. Trop slightly trending up 112 > 122. CBC stable. Pt was on apixaban  PTA for hx of PE. D-dimer in 03/2024 was negative and per outpatient notes, pt should be off apixaban . Will order a baseline heparin  level just in case.   Goal of Therapy:  Heparin  level 0.3-0.7 units/ml Monitor platelets by anticoagulation protocol: Yes   Plan:  Give 4000 units bolus x 1 Start heparin  infusion at 700 units/hr Check anti-Xa level in 6 hours and daily while on heparin  Continue to monitor H&H and platelets  Cathaleen GORMAN Blanch, PharmD, BCPS 05/18/2024,9:59 PM

## 2024-05-19 ENCOUNTER — Other Ambulatory Visit (HOSPITAL_COMMUNITY)

## 2024-05-19 ENCOUNTER — Encounter (HOSPITAL_COMMUNITY): Admission: EM | Disposition: A | Payer: Self-pay | Source: Home / Self Care | Attending: Internal Medicine

## 2024-05-19 ENCOUNTER — Other Ambulatory Visit (HOSPITAL_COMMUNITY): Payer: Self-pay

## 2024-05-19 ENCOUNTER — Telehealth (HOSPITAL_COMMUNITY): Payer: Self-pay | Admitting: Pharmacy Technician

## 2024-05-19 DIAGNOSIS — I2510 Atherosclerotic heart disease of native coronary artery without angina pectoris: Secondary | ICD-10-CM | POA: Diagnosis not present

## 2024-05-19 DIAGNOSIS — I10 Essential (primary) hypertension: Secondary | ICD-10-CM | POA: Diagnosis not present

## 2024-05-19 DIAGNOSIS — I214 Non-ST elevation (NSTEMI) myocardial infarction: Secondary | ICD-10-CM | POA: Diagnosis not present

## 2024-05-19 LAB — TROPONIN I (HIGH SENSITIVITY)
Troponin I (High Sensitivity): 114 ng/L (ref <18)
Troponin I (High Sensitivity): 1875 ng/L (ref <18)
Troponin I (High Sensitivity): 2601 ng/L (ref <18)
Troponin I (High Sensitivity): 2789 ng/L (ref <18)

## 2024-05-19 LAB — CBC
HCT: 36.1 % (ref 36.0–46.0)
Hemoglobin: 12.7 g/dL (ref 12.0–15.0)
MCH: 32.9 pg (ref 26.0–34.0)
MCHC: 35.2 g/dL (ref 30.0–36.0)
MCV: 93.5 fL (ref 80.0–100.0)
Platelets: 201 K/uL (ref 150–400)
RBC: 3.86 MIL/uL — ABNORMAL LOW (ref 3.87–5.11)
RDW: 11.1 % — ABNORMAL LOW (ref 11.5–15.5)
WBC: 8.7 K/uL (ref 4.0–10.5)
nRBC: 0 % (ref 0.0–0.2)

## 2024-05-19 LAB — BASIC METABOLIC PANEL WITH GFR
Anion gap: 8 (ref 5–15)
BUN: 10 mg/dL (ref 6–20)
CO2: 21 mmol/L — ABNORMAL LOW (ref 22–32)
Calcium: 8.5 mg/dL — ABNORMAL LOW (ref 8.9–10.3)
Chloride: 112 mmol/L — ABNORMAL HIGH (ref 98–111)
Creatinine, Ser: 0.71 mg/dL (ref 0.44–1.00)
GFR, Estimated: >60 mL/min (ref >60)
Glucose, Bld: 138 mg/dL — ABNORMAL HIGH (ref 70–99)
Potassium: 3.7 mmol/L (ref 3.5–5.1)
Sodium: 141 mmol/L (ref 135–145)

## 2024-05-19 LAB — HEPARIN LEVEL (UNFRACTIONATED)
Heparin Unfractionated: 0.31 [IU]/mL (ref 0.30–0.70)
Heparin Unfractionated: 0.25 [IU]/mL — ABNORMAL LOW (ref 0.30–0.70)

## 2024-05-19 LAB — TSH: TSH: 1.955 u[IU]/mL (ref 0.350–4.500)

## 2024-05-19 LAB — MRSA NEXT GEN BY PCR, NASAL: MRSA by PCR Next Gen: NOT DETECTED

## 2024-05-19 LAB — APTT: aPTT: 60 s — ABNORMAL HIGH (ref 24–36)

## 2024-05-19 LAB — HEMOGLOBIN A1C
Hgb A1c MFr Bld: 7.3 % — ABNORMAL HIGH (ref 4.8–5.6)
Mean Plasma Glucose: 162.81 mg/dL

## 2024-05-19 LAB — POCT ACTIVATED CLOTTING TIME
Activated Clotting Time: 297 s
Activated Clotting Time: 337 s

## 2024-05-19 LAB — HIV ANTIBODY (ROUTINE TESTING W REFLEX): HIV Screen 4th Generation wRfx: NONREACTIVE

## 2024-05-19 LAB — PHOSPHORUS: Phosphorus: 2.9 mg/dL (ref 2.5–4.6)

## 2024-05-19 LAB — MAGNESIUM: Magnesium: 2.1 mg/dL (ref 1.7–2.4)

## 2024-05-19 SURGERY — LEFT HEART CATH AND CORONARY ANGIOGRAPHY
Anesthesia: LOCAL

## 2024-05-19 MED ORDER — SODIUM CHLORIDE 0.9 % IV SOLN: INTRAVENOUS | Status: DC

## 2024-05-19 MED ORDER — NITROGLYCERIN 1 MG/10 ML FOR IR/CATH LAB
INTRA_ARTERIAL | Status: AC
  Filled 2024-05-19: qty 10

## 2024-05-19 MED ORDER — ASPIRIN 81 MG PO CHEW
81.0000 mg | CHEWABLE_TABLET | Freq: Every day | ORAL | 6 refills | Status: AC
  Filled 2024-05-19: qty 30, 30d supply, fill #0

## 2024-05-19 MED ORDER — CHLORHEXIDINE GLUCONATE CLOTH 2 % EX PADS
6.0000 | MEDICATED_PAD | Freq: Every day | CUTANEOUS | Status: DC
  Administered 2024-05-19 – 2024-05-22 (×4): 6 via TOPICAL

## 2024-05-19 MED ORDER — TICAGRELOR 90 MG PO TABS
ORAL_TABLET | ORAL | Status: DC | PRN
  Administered 2024-05-19: 180 mg via ORAL

## 2024-05-19 MED ORDER — MIDAZOLAM HCL 2 MG/2ML IJ SOLN
INTRAMUSCULAR | Status: DC | PRN
  Administered 2024-05-19 (×2): 1 mg via INTRAVENOUS

## 2024-05-19 MED ORDER — LIDOCAINE HCL (PF) 1 % IJ SOLN
INTRAMUSCULAR | Status: AC
  Filled 2024-05-19: qty 30

## 2024-05-19 MED ORDER — HEPARIN SODIUM (PORCINE) 1000 UNIT/ML IJ SOLN
INTRAMUSCULAR | Status: DC | PRN
  Administered 2024-05-19 (×2): 4000 [IU] via INTRAVENOUS

## 2024-05-19 MED ORDER — ASPIRIN 81 MG PO CHEW: 81.0000 mg | CHEWABLE_TABLET | Freq: Every day | ORAL | Status: DC

## 2024-05-19 MED ORDER — TICAGRELOR 90 MG PO TABS
90.0000 mg | ORAL_TABLET | Freq: Two times a day (BID) | ORAL | Status: DC
Start: 2024-05-20 — End: 2024-05-22
  Administered 2024-05-19 – 2024-05-22 (×6): 90 mg via ORAL
  Filled 2024-05-19 (×6): qty 1

## 2024-05-19 MED ORDER — IOHEXOL 350 MG/ML SOLN
INTRAVENOUS | Status: DC | PRN
  Administered 2024-05-19: 88 mL

## 2024-05-19 MED ORDER — SODIUM CHLORIDE 0.9% FLUSH
3.0000 mL | Freq: Two times a day (BID) | INTRAVENOUS | Status: DC
  Administered 2024-05-19 – 2024-05-22 (×6): 3 mL via INTRAVENOUS

## 2024-05-19 MED ORDER — VERAPAMIL HCL 2.5 MG/ML IV SOLN
INTRAVENOUS | Status: AC
  Filled 2024-05-19: qty 2

## 2024-05-19 MED ORDER — VERAPAMIL HCL 2.5 MG/ML IV SOLN
INTRAVENOUS | Status: DC | PRN
  Administered 2024-05-19: 10 mL via INTRA_ARTERIAL

## 2024-05-19 MED ORDER — SODIUM CHLORIDE 0.9 % IV SOLN: 250.0000 mL | INTRAVENOUS | Status: AC | PRN

## 2024-05-19 MED ORDER — DIPHENHYDRAMINE HCL 25 MG PO CAPS: 50.0000 mg | ORAL_CAPSULE | Freq: Once | ORAL | Status: DC

## 2024-05-19 MED ORDER — HEPARIN (PORCINE) IN NACL 1000-0.9 UT/500ML-% IV SOLN
INTRAVENOUS | Status: DC | PRN
  Administered 2024-05-19 (×3): 500 mL

## 2024-05-19 MED ORDER — LIDOCAINE HCL (PF) 1 % IJ SOLN
INTRAMUSCULAR | Status: DC | PRN
  Administered 2024-05-19: 2 mL via INTRADERMAL

## 2024-05-19 MED ORDER — ONDANSETRON HCL 4 MG/2ML IJ SOLN
INTRAMUSCULAR | Status: DC | PRN
  Administered 2024-05-19: 4 mg via INTRAVENOUS

## 2024-05-19 MED ORDER — VERAPAMIL HCL 2.5 MG/ML IV SOLN
INTRAVENOUS | Status: DC | PRN
  Administered 2024-05-19: 300 ug via INTRACORONARY

## 2024-05-19 MED ORDER — FENTANYL CITRATE (PF) 100 MCG/2ML IJ SOLN
INTRAMUSCULAR | Status: AC
  Filled 2024-05-19: qty 2

## 2024-05-19 MED ORDER — HYDRALAZINE HCL 20 MG/ML IJ SOLN: 10.0000 mg | INTRAMUSCULAR | Status: AC | PRN

## 2024-05-19 MED ORDER — NITROGLYCERIN 1 MG/10 ML FOR IR/CATH LAB
INTRA_ARTERIAL | Status: DC | PRN
  Administered 2024-05-19: 200 ug via INTRACORONARY

## 2024-05-19 MED ORDER — HEPARIN SODIUM (PORCINE) 1000 UNIT/ML IJ SOLN
INTRAMUSCULAR | Status: AC
  Filled 2024-05-19: qty 10

## 2024-05-19 MED ORDER — FENTANYL CITRATE (PF) 100 MCG/2ML IJ SOLN
INTRAMUSCULAR | Status: DC | PRN
  Administered 2024-05-19: 25 ug via INTRAVENOUS
  Administered 2024-05-19: 50 ug via INTRAVENOUS

## 2024-05-19 MED ORDER — TICAGRELOR 90 MG PO TABS
90.0000 mg | ORAL_TABLET | Freq: Two times a day (BID) | ORAL | 6 refills | Status: AC
  Filled 2024-05-19: qty 60, 30d supply, fill #0

## 2024-05-19 MED ORDER — ONDANSETRON HCL 4 MG/2ML IJ SOLN
INTRAMUSCULAR | Status: AC
  Filled 2024-05-19: qty 2

## 2024-05-19 MED ORDER — ASPIRIN 81 MG PO CHEW: 81.0000 mg | CHEWABLE_TABLET | ORAL | Status: DC

## 2024-05-19 MED ORDER — METHYLPREDNISOLONE SODIUM SUCC 40 MG IJ SOLR: 40.0000 mg | INTRAMUSCULAR | Status: DC

## 2024-05-19 MED ORDER — MIDAZOLAM HCL 2 MG/2ML IJ SOLN
INTRAMUSCULAR | Status: AC
  Filled 2024-05-19: qty 2

## 2024-05-19 MED ORDER — SODIUM CHLORIDE 0.9% FLUSH: 3.0000 mL | INTRAVENOUS | Status: DC | PRN

## 2024-05-19 MED ORDER — TICAGRELOR 90 MG PO TABS
ORAL_TABLET | ORAL | Status: AC
  Filled 2024-05-19: qty 2

## 2024-05-19 MED ORDER — FREE WATER
500.0000 mL | Freq: Once | Status: AC
  Administered 2024-05-19: 500 mL via ORAL

## 2024-05-19 MED ORDER — DIPHENHYDRAMINE HCL 50 MG/ML IJ SOLN: 50.0000 mg | Freq: Once | INTRAMUSCULAR | Status: DC

## 2024-05-19 MED ORDER — LABETALOL HCL 5 MG/ML IV SOLN: 10.0000 mg | INTRAVENOUS | Status: AC | PRN

## 2024-05-19 SURGICAL SUPPLY — 19 items
BALLOON EMERGE MR 2.5X12 (BALLOONS) IMPLANT
BALLOON SAPPHIRE NC24 3.5X12 (BALLOONS) IMPLANT
BALLOON ~~LOC~~ EMERGE MR 3.0X20 (BALLOONS) IMPLANT
CATH INFINITI AMBI 5FR TG (CATHETERS) IMPLANT
CATH LAUNCHER 6FR EBU 3 (CATHETERS) IMPLANT
CATH OPTICROSS HD (CATHETERS) IMPLANT
DRAPE IVUS SLED (BAG) IMPLANT
GLIDESHEATH SLEND A-KIT 6F 22G (SHEATH) IMPLANT
GUIDEWIRE INQWIRE 1.5J.035X260 (WIRE) IMPLANT
GUIDEWIRE TIGER .035X300 (WIRE) IMPLANT
KIT ENCORE 26 ADVANTAGE (KITS) IMPLANT
KIT HEMO VALVE WATCHDOG (MISCELLANEOUS) IMPLANT
PACK CARDIAC CATHETERIZATION (CUSTOM PROCEDURE TRAY) ×1 IMPLANT
PAD ELECT DEFIB RADIOL ZOLL (MISCELLANEOUS) IMPLANT
SET ATX-X65L (MISCELLANEOUS) IMPLANT
SHEATH PROBE COVER 6X72 (BAG) IMPLANT
STENT SYNERGY XD 3.0X20 (Permanent Stent) IMPLANT
STENT SYNERGY XD 3.50X20 (Permanent Stent) IMPLANT
WIRE RUNTHROUGH .014X180CM (WIRE) IMPLANT

## 2024-05-19 NOTE — Progress Notes (Signed)
 PHARMACY - ANTICOAGULATION CONSULT NOTE  Pharmacy Consult for heparin  Indication: chest pain/ACS  Labs: Recent Labs    05/18/24 1805 05/18/24 2038 05/18/24 2221 05/19/24 0541  HGB 14.5  --   --  12.7  HCT 41.9  --   --  36.1  PLT 237  --   --  201  APTT 25  --   --  60*  LABPROT 12.4  --   --   --   INR 0.9  --   --   --   HEPARINUNFRC  --   --  <0.10* 0.31  CREATININE 0.77  --   --   --   TROPONINIHS 112* 122* 114*  --    Assessment/Plan:  53yo female therapeutic on heparin  with initial dosing for CP. Will continue infusion at current rate of 700 units/hr and confirm stable with additional level.  Marvetta Dauphin, PharmD, BCPS 05/19/2024 6:20 AM

## 2024-05-19 NOTE — Consult Note (Signed)
 Cardiology Consultation  Patient ID: Ashlee Brown MRN: [id number]; DOB: [birth year]  Admit date: 05/18/2024 Date of Consult: 05/19/2024  PCP:  Katrinka Aquas, MD   [facility] Providers Cardiologist:  Jayson Sierras, MD    Patient Profile: Ashlee Brown is a 53 y.o. female with a hx of pulmonary embolism (diagnosed in 11/2023 and Eliquis  discontinued in 02/2024), HTN, HLD and palpitations (prior monitor showing rare PAC's and PVC's) who is being seen 05/19/2024 for the evaluation of chest pain and elevated troponin values at the request of Dr. Keturah.  History of Present Illness: [name] was evaluated Dr. Sierras in 03/2024 and was overall feeling well at that time. She had been off Eliquis  with plans for repeat D-dimer per Hematology. No changes were made to her cardiac medications and she was continued on Benazepril  20 mg daily, Toprol -XL 25 mg daily, Crestor  20 mg daily and spironolactone  25 mg daily.  She did contact the office earlier this month reporting episodes of intermittent chest pain and had been taking Augmentin for an ear infection. Had tried Prilosec without improvement. Dr. Sierras recommended ED evaluation but she reported symptoms had started to improve and she felt they were possibly due to her antibiotic.  She presented to [name] yesterday evening for evaluation of chest pain. In talking with the patient today, she reports having intermittent episodes of chest pain over the past several weeks.  She initially felt symptoms were due to being on an antibiotic but then later thought symptoms were due to acid reflux as she reports having chest pressure/burning which would occur with food consumption. Says she had previously been told she had esophageal dysmotility by prior CT but has not been evaluated by GI for this. Over the past few days, she reports her pain has increased in frequency and severity.  Notes episodes of chest burning/pressure which have been  occurring with walking. Symptoms typically last for a few minutes and then resolve. No specific dyspnea on exertion, orthopnea, PND or pitting edema. Unaware of any personal history of CAD or CHF. No known family history of CAD.  Initial labs showed WBC 7.5, Hgb 14.5, platelets 237, Na+ 140, K+ 3.8 and creatinine 0.77. BNP normal at 74. Initial and repeat Hs Troponin values have been elevated at 112, 122, 114 and 1875.  CXR with no acute cardiopulmonary abnormalities. CTA showed no evidence of significant PE.  Noted to have hazy airspace disease in the lung bases which likely represents dependent atelectasis or edema and fatty infiltration of the liver along with cholelithiasis without evidence of acute cholecystitis. EKG shows NSR, HR 73 with TWI along Leads I, AVL and V2.   She received ASA 325 mg and has been started on ASA 81 mg daily. Received SL NTG x 2 while in the ED. Scheduled to receive Lopressor  12.5 mg twice daily and Crestor  20 mg daily. Has been started on IV Heparin  for anticoagulation.  Past Medical History:  Diagnosis Date   Hyperlipidemia    Hypertension     Past Surgical History:  Procedure Laterality Date   APPENDECTOMY     CESAREAN SECTION     LEG SURGERY Left      Home Medications:  Prior to Admission medications   Medication Sig Start Date End Date Taking? Authorizing Provider  benazepril  (LOTENSIN ) 20 MG tablet Take 1 tablet (20 mg total) by mouth daily. 01/04/24  Yes Sierras Jayson MATSU, MD  esomeprazole (NEXIUM) 20 MG capsule Take 20 mg by mouth daily  at 12 noon.   Yes [provider]  metoprolol  succinate (TOPROL -XL) 25 MG 24 hr tablet TAKE ONE TABLET BY MOUTH ONCE DAILY 01/10/24  Yes Debera Jayson MATSU, MD  rosuvastatin  (CRESTOR ) 20 MG tablet Take 1 tablet (20 mg total) by mouth daily. 12/16/23  Yes Debera Jayson MATSU, MD  spironolactone  (ALDACTONE ) 25 MG tablet TAKE ONE TABLET BY MOUTH EVERY DAY 01/10/24  Yes Debera Jayson MATSU, MD    Scheduled Meds:   aspirin   81 mg Oral Daily   metoprolol  tartrate  12.5 mg Oral BID    morphine  injection  4 mg Intravenous Once   ondansetron  (ZOFRAN ) IV  4 mg Intravenous Once   pantoprazole   40 mg Oral Daily   rosuvastatin   20 mg Oral QHS   sodium chloride  flush  3 mL Intravenous Q12H   Continuous Infusions:  heparin  700 Units/hr (05/18/24 2221)   PRN Meds: acetaminophen , albuterol , melatonin, morphine  injection, nitroGLYCERIN , nitroGLYCERIN , ondansetron  (ZOFRAN ) IV, polyethylene glycol  Allergies:    Allergies  Allergen Reactions   Sulfamethoxazole Other (See Comments)    Blood clots   Trimethoprim Other (See Comments)    Blood clots   Escitalopram Oxalate Other (See Comments)    Unknown    Lisinopril Other (See Comments)    Unknown    Naltrexone-Bupropion Hcl Er Other (See Comments)    Unknown     Social History:   Social History   Socioeconomic History   Marital status: Married    Spouse name: Not on file   Number of children: Not on file   Years of education: Not on file   Highest education level: Not on file  Occupational History   Not on file  Tobacco Use   Smoking status: Never    Passive exposure: Never   Smokeless tobacco: Never  Vaping Use   Vaping status: Never Used  Substance and Sexual Activity   Alcohol use: Yes    Comment: Occasionaly   Drug use: Never   Sexual activity: Yes  Other Topics Concern   Not on file  Social History Narrative   Not on file    Family History:    Family History  Problem Relation Age of Onset   Hypertension Mother    Hyperlipidemia Mother      ROS:  Please see the history of present illness.   All other ROS reviewed and negative.     Physical Exam/Data: Vitals:   05/19/24 0300 05/19/24 0330 05/19/24 0400 05/19/24 0430  BP: 106/75 114/72 121/85 125/88  Pulse: 62 68 69 70  Resp: 20 20 (!) 21 20  Temp:      TempSrc:      SpO2: 93% 92% 95% 96%  Weight:      Height:        Intake/Output Summary (Last 24 hours) at  05/19/2024 0851 Last data filed at 05/18/2024 2252 Gross per 24 hour  Intake 1000 ml  Output --  Net 1000 ml      05/18/2024    4:51 PM 04/20/2024   11:47 AM 04/19/2024   10:38 AM  Last 3 Weights  Weight (lbs) 162 lb 14.7 oz 162 lb 14.7 oz 166 lb 9.6 oz  Weight (kg) 73.9 kg 73.9 kg 75.569 kg     Body mass index is 29.8 kg/m.  General:  Well nourished, well developed female. Tearful at times during this encounter.  HEENT: normal Neck: no JVD Vascular: No carotid bruits; Distal pulses 2+ bilaterally Cardiac:  normal  S1, S2; RRR; no murmur  Lungs:  clear to auscultation bilaterally, no wheezing, rhonchi or rales  Abd: soft, nontender, no hepatomegaly  Ext: no pitting edema Musculoskeletal:  No deformities, BUE and BLE strength normal and equal Skin: warm and dry  Neuro:  CNs 2-12 intact, no focal abnormalities noted Psych:  Normal affect   Telemetry:  Telemetry was personally reviewed and demonstrates: NSR, HR in 70's to 80's with occasional PVC's.   Relevant CV Studies:  Echocardiogram: 05/2021 IMPRESSIONS     1. Left ventricular ejection fraction, by estimation, is 65 to 70%. The  left ventricle has normal function. The left ventricle has no regional  wall motion abnormalities. Indeterminate diastolic filling due to E-A  fusion.   2. Right ventricular systolic function is normal. The right ventricular  size is normal. There is normal pulmonary artery systolic pressure.   3. The mitral valve is normal in structure. No evidence of mitral valve  regurgitation.   4. The aortic valve is normal in structure. Aortic valve regurgitation is  not visualized. No aortic stenosis is present.   Comparison(s): No prior Echocardiogram.   Event Monitor: 11/2021 ZIO XT reviewed. 2 days, 21 hours analyzed. Predominant rhythm is sinus with heart rate ranging from 40 bpm up to 124 bpm and average heart rate 65 bpm. Rare PACs including atrial couplets were noted representing less than 1%  total beats. There were also rare PVCs representing less than 1% total beats. No significant arrhythmias or pauses.   Laboratory Data: High Sensitivity Troponin:   Recent Labs  Lab 05/18/24 1805 05/18/24 2038 05/18/24 2221 05/19/24 0541  TROPONINIHS 112* 122* 114* 1,875*     Chemistry Recent Labs  Lab 05/18/24 1805 05/18/24 2221 05/19/24 0541  NA 140  --  141  K 3.8  --  3.7  CL 104  --  112*  CO2 22  --  21*  GLUCOSE 148*  --  138*  BUN 12  --  10  CREATININE 0.77  --  0.71  CALCIUM  9.1  --  8.5*  MG  --  2.1  --   GFRNONAA >60  --  >60  ANIONGAP 14  --  8    Recent Labs  Lab 05/18/24 1805  PROT 7.3  ALBUMIN 4.3  AST 42*  ALT 45*  ALKPHOS 74  BILITOT 0.8   Lipids No results for input(s): CHOL, TRIG, HDL, LABVLDL, LDLCALC, CHOLHDL in the last 168 hours.  Hematology Recent Labs  Lab 05/18/24 1805 05/19/24 0541  WBC 7.5 8.7  RBC 4.37 3.86*  HGB 14.5 12.7  HCT 41.9 36.1  MCV 95.9 93.5  MCH 33.2 32.9  MCHC 34.6 35.2  RDW 11.0* 11.1*  PLT 237 201   Thyroid   Recent Labs  Lab 05/18/24 2221  TSH 1.955    BNP Recent Labs  Lab 05/18/24 1805  BNP 74.0    DDimer No results for input(s): DDIMER in the last 168 hours.  Radiology/Studies:  CT Angio Chest PE W and/or Wo Contrast Result Date: 05/18/2024 CLINICAL DATA:  Pulmonary embolus suspected with high probability. Persistent upper mid chest pain. Pain and burning sensation. EXAM: CT ANGIOGRAPHY CHEST WITH CONTRAST TECHNIQUE: Multidetector CT imaging of the chest was performed using the standard protocol during bolus administration of intravenous contrast. Multiplanar CT image reconstructions and MIPs were obtained to evaluate the vascular anatomy. RADIATION DOSE REDUCTION: This exam was performed according to the departmental dose-optimization program which includes automated exposure control, adjustment of the  mA and/or kV according to patient size and/or use of iterative reconstruction  technique. CONTRAST:  75mL OMNIPAQUE  IOHEXOL  350 MG/ML SOLN COMPARISON:  Chest radiograph 05/18/2024 FINDINGS: Cardiovascular: Technically adequate study with good opacification of the central and segmental pulmonary arteries. Mild motion artifact. No focal filling defects are demonstrated. No evidence of significant pulmonary embolus. Normal caliber thoracic aorta. Scattered aortic and coronary artery calcification. No aneurysm or dissection. Heart size is normal. No pericardial effusions. Mediastinum/Nodes: No enlarged mediastinal, hilar, or axillary lymph nodes. Thyroid  gland, trachea, and esophagus demonstrate no significant findings. Lungs/Pleura: Patchy airspace changes in the lung bases may represent dependent atelectasis or edema. Multifocal pneumonia less likely based on appearance. No pleural effusion or pneumothorax. Upper Abdomen: Diffuse fatty infiltration of the liver. Cholelithiasis with large stone in the gallbladder. No acute abnormalities. Musculoskeletal: No chest wall abnormality. No acute or significant osseous findings. Review of the MIP images confirms the above findings. IMPRESSION: 1. No evidence of significant pulmonary embolus. 2. Hazy airspace disease in the lung bases likely represent dependent atelectasis or edema. 3. Fatty infiltration of the liver. 4. Cholelithiasis without evidence of acute cholecystitis. Electronically Signed   By: Elsie Gravely M.D.   On: 05/18/2024 21:10   DG Chest 2 View Result Date: 05/18/2024 CLINICAL DATA:  chest pain EXAM: CHEST - 2 VIEW COMPARISON:  05/14/2021 FINDINGS: No focal airspace consolidation, pleural effusion, or pneumothorax. No cardiomegaly.No acute fracture or destructive lesion. IMPRESSION: No acute cardiopulmonary abnormality. Electronically Signed   By: Rogelia Myers M.D.   On: 05/18/2024 17:48     Assessment and Plan:   1. NSTEMI - Presented with more frequent episodes of chest pain over the past several weeks and overall  atypical initially as typically triggered by food consumption but developed worsening symptoms over the past few days which have been occurring with minimal activity. - CTA negative for PE. Initial Hs Troponin values flat between 112 - 122 but rechecked this morning and significantly elevated at 1875. EKG does show TWI along the anterolateral leads which is more prominent when compared to prior tracings. FLP and echocardiogram pending. - Given her presentation and elevated enzymes, reviewed indications for cardiac catheterization for definitive evaluation with the patient. Also discussed with the patient's husband over the phone.  They are in agreement with proceeding. The patient understands that risks include but are not limited to stroke (1 in 1000), death (1 in 1000), kidney failure [usually temporary] (1 in 500), bleeding (1 in 200), allergic reaction [possibly serious] (1 in 200).  - She has been added to the cardiac catheterization scheduled for today. She has tolerated contrast in the past without any issues but does report having chest pain while receiving contrast for her CT scan last night. Will order preprocedure IV Benadryl  and Prednisone. Given that she received contrast yesterday as well, will order pre-procedure IV fluids. - Continue ASA 81 mg daily, Lopressor  12.5 mg twice daily, Crestor  20 mg daily and IV Heparin .  2. History of PE - Diagnosed in 11/2023 and Eliquis  discontinued in 02/2024 by Hematology. Had recently traveled 5 hours for a trip and had a COVID infection prior to her diagnosis. CTA this admission negative for PE.    3. HTN - BP was at 125/88 on most recent check. Continue Lopressor  12.5 mg twice daily. PTA Benazepril  20 mg daily and Spironolactone  25 mg daily currently on hold given she received contrast yesterday and is scheduled for cath today. Resume after her procedure.   4. HLD -  Will order a repeat FLP. Continue Crestor  20 mg daily.  5. Palpitations - Prior  monitor showed rare PAC's and PVC's with less than 1% burden. She is noted to have occasional PVC's on telemetry. Continue Lopressor  12.5 mg twice daily. Can likely switch back to PTA Toprol -XL at discharge.   Risk Assessment/Risk Scores:  TIMI Risk Score for Unstable Angina or Non-ST Elevation MI:   The patient's TIMI risk score is 4, which indicates a 20% risk of all cause mortality, new or recurrent myocardial infarction or need for urgent revascularization in the next 14 days.{  For questions or updates, please contact [facility] Please consult [email or web address] for contact info under   Signed, Laymon CHRISTELLA Qua, PA-C  05/19/2024 8:51 AM

## 2024-05-19 NOTE — TOC CM/SW Note (Signed)
 Transition of Care [facility]) - Inpatient Brief Assessment   Patient Details  Name: Ashlee Brown MRN: [id number] Date of Birth: [birth year]  Transition of Care [facility]) CM/SW Contact:    Lucie Lunger, LCSWA Phone Number: 05/19/2024, 8:18 AM   Clinical Narrative: Transition of Care Department [facility]) has reviewed patient and no TOC needs have been identified at this time. We will continue to monitor patient advancement through interdiciplinary progression rounds. If new patient transition needs arise, please place a TOC consult.  Transition of Care Asessment: Insurance and Status: Insurance coverage has been reviewed Patient has primary care physician: Yes Home environment has been reviewed: From home Prior level of function:: Independent Prior/Current Home Services: No current home services Social Drivers of Health Review: SDOH reviewed no interventions necessary Readmission risk has been reviewed: Yes Transition of care needs: no transition of care needs at this time

## 2024-05-19 NOTE — ED Notes (Signed)
 Phleb stuck twice, was unable to get blood

## 2024-05-19 NOTE — H&P (View-Only) (Signed)
 Cardiology Consultation  Patient ID: Ashlee Brown MRN: [id number]; DOB: [birth year]  Admit date: 05/18/2024 Date of Consult: 05/19/2024  PCP:  Ashlee Aquas, MD   [facility] Providers Cardiologist:  Ashlee Sierras, MD    Patient Profile: Ashlee Brown is a 53 y.o. female with a hx of pulmonary embolism (diagnosed in 11/2023 and Eliquis  discontinued in 02/2024), HTN, HLD and palpitations (prior monitor showing rare PAC's and PVC's) who is being seen 05/19/2024 for the evaluation of chest pain and elevated troponin values at the request of Dr. Keturah.  History of Present Illness: Ashlee Brown was evaluated Dr. Sierras in 03/2024 and was overall feeling well at that time. She had been off Eliquis  with plans for repeat D-dimer per Hematology. No changes were made to her cardiac medications and she was continued on Benazepril  20 mg daily, Toprol -XL 25 mg daily, Crestor  20 mg daily and spironolactone  25 mg daily.  She did contact the office earlier this month reporting episodes of intermittent chest pain and had been taking Augmentin for an ear infection. Had tried Prilosec without improvement. Dr. Sierras recommended Brown evaluation but she reported symptoms had started to improve and she felt they were possibly due to her antibiotic.  She presented to [facility] yesterday evening for evaluation of chest pain. In talking with the patient today, she reports having intermittent episodes of chest pain over the past several weeks.  She initially felt symptoms were due to being on an antibiotic but then later thought symptoms were due to acid reflux as she reports having chest pressure/burning which would occur with food consumption. Says she had previously been told she had esophageal dysmotility by prior CT but has not been evaluated by GI for this. Over the past few days, she reports her pain has increased in frequency and severity.  Notes episodes of chest burning/pressure which have been  occurring with walking. Symptoms typically last for a few minutes and then resolve. No specific dyspnea on exertion, orthopnea, PND or pitting edema. Unaware of any personal history of CAD or CHF. No known family history of CAD.  Initial labs showed WBC 7.5, Hgb 14.5, platelets 237, Na+ 140, K+ 3.8 and creatinine 0.77. BNP normal at 74. Initial and repeat Hs Troponin values have been elevated at 112, 122, 114 and 1875.  CXR with no acute cardiopulmonary abnormalities. CTA showed no evidence of significant PE.  Noted to have hazy airspace disease in the lung bases which likely represents dependent atelectasis or edema and fatty infiltration of the liver along with cholelithiasis without evidence of acute cholecystitis. EKG shows NSR, HR 73 with TWI along Leads I, AVL and V2.   She received ASA 325 mg and has been started on ASA 81 mg daily. Received SL NTG x 2 while in the Brown. Scheduled to receive Lopressor  12.5 mg twice daily and Crestor  20 mg daily. Has been started on IV Heparin  for anticoagulation.  Past Medical History:  Diagnosis Date   Hyperlipidemia    Hypertension     Past Surgical History:  Procedure Laterality Date   APPENDECTOMY     CESAREAN SECTION     LEG SURGERY Left      Home Medications:  Prior to Admission medications   Medication Sig Start Date End Date Taking? Authorizing Provider  benazepril  (LOTENSIN ) 20 MG tablet Take 1 tablet (20 mg total) by mouth daily. 01/04/24  Yes Brown Ashlee MATSU, MD  esomeprazole (NEXIUM) 20 MG capsule Take 20 mg by mouth daily  at 12 noon.   Yes [provider]  metoprolol  succinate (TOPROL -XL) 25 MG 24 hr tablet TAKE ONE TABLET BY MOUTH ONCE DAILY 01/10/24  Yes Debera Ashlee MATSU, MD  rosuvastatin  (CRESTOR ) 20 MG tablet Take 1 tablet (20 mg total) by mouth daily. 12/16/23  Yes Debera Ashlee MATSU, MD  spironolactone  (ALDACTONE ) 25 MG tablet TAKE ONE TABLET BY MOUTH EVERY DAY 01/10/24  Yes Debera Ashlee MATSU, MD    Scheduled Meds:   aspirin   81 mg Oral Daily   metoprolol  tartrate  12.5 mg Oral BID    morphine  injection  4 mg Intravenous Once   ondansetron  (ZOFRAN ) IV  4 mg Intravenous Once   pantoprazole   40 mg Oral Daily   rosuvastatin   20 mg Oral QHS   sodium chloride  flush  3 mL Intravenous Q12H   Continuous Infusions:  heparin  700 Units/hr (05/18/24 2221)   PRN Meds: acetaminophen , albuterol , melatonin, morphine  injection, nitroGLYCERIN , nitroGLYCERIN , ondansetron  (ZOFRAN ) IV, polyethylene glycol  Allergies:    Allergies  Allergen Reactions   Sulfamethoxazole Other (See Comments)    Blood clots   Trimethoprim Other (See Comments)    Blood clots   Escitalopram Oxalate Other (See Comments)    Unknown    Lisinopril Other (See Comments)    Unknown    Naltrexone-Bupropion Hcl Er Other (See Comments)    Unknown     Social History:   Social History   Socioeconomic History   Marital status: Married    Spouse name: Not on file   Number of children: Not on file   Years of education: Not on file   Highest education level: Not on file  Occupational History   Not on file  Tobacco Use   Smoking status: Never    Passive exposure: Never   Smokeless tobacco: Never  Vaping Use   Vaping status: Never Used  Substance and Sexual Activity   Alcohol use: Yes    Comment: Occasionaly   Drug use: Never   Sexual activity: Yes  Other Topics Concern   Not on file  Social History Narrative   Not on file    Family History:    Family History  Problem Relation Age of Onset   Hypertension Mother    Hyperlipidemia Mother      ROS:  Please see the history of present illness.   All other ROS reviewed and negative.     Physical Exam/Data: Vitals:   05/19/24 0300 05/19/24 0330 05/19/24 0400 05/19/24 0430  BP: 106/75 114/72 121/85 125/88  Pulse: 62 68 69 70  Resp: 20 20 (!) 21 20  Temp:      TempSrc:      SpO2: 93% 92% 95% 96%  Weight:      Height:        Intake/Output Summary (Last 24 hours) at  05/19/2024 0851 Last data filed at 05/18/2024 2252 Gross per 24 hour  Intake 1000 ml  Output --  Net 1000 ml      05/18/2024    4:51 PM 04/20/2024   11:47 AM 04/19/2024   10:38 AM  Last 3 Weights  Weight (lbs) 162 lb 14.7 oz 162 lb 14.7 oz 166 lb 9.6 oz  Weight (kg) 73.9 kg 73.9 kg 75.569 kg     Body mass index is 29.8 kg/m.  General:  Well nourished, well developed female. Tearful at times during this encounter.  HEENT: normal Neck: no JVD Vascular: No carotid bruits; Distal pulses 2+ bilaterally Cardiac:  normal  S1, S2; RRR; no murmur  Lungs:  clear to auscultation bilaterally, no wheezing, rhonchi or rales  Abd: soft, nontender, no hepatomegaly  Ext: no pitting edema Musculoskeletal:  No deformities, BUE and BLE strength normal and equal Skin: warm and dry  Neuro:  CNs 2-12 intact, no focal abnormalities noted Psych:  Normal affect   Telemetry:  Telemetry was personally reviewed and demonstrates: NSR, HR in 70's to 80's with occasional PVC's.   Relevant CV Studies:  Echocardiogram: 05/2021 IMPRESSIONS     1. Left ventricular ejection fraction, by estimation, is 65 to 70%. The  left ventricle has normal function. The left ventricle has no regional  wall motion abnormalities. Indeterminate diastolic filling due to E-A  fusion.   2. Right ventricular systolic function is normal. The right ventricular  size is normal. There is normal pulmonary artery systolic pressure.   3. The mitral valve is normal in structure. No evidence of mitral valve  regurgitation.   4. The aortic valve is normal in structure. Aortic valve regurgitation is  not visualized. No aortic stenosis is present.   Comparison(s): No prior Echocardiogram.   Event Monitor: 11/2021 ZIO XT reviewed. 2 days, 21 hours analyzed. Predominant rhythm is sinus with heart rate ranging from 40 bpm up to 124 bpm and average heart rate 65 bpm. Rare PACs including atrial couplets were noted representing less than 1%  total beats. There were also rare PVCs representing less than 1% total beats. No significant arrhythmias or pauses.   Laboratory Data: High Sensitivity Troponin:   Recent Labs  Lab 05/18/24 1805 05/18/24 2038 05/18/24 2221 05/19/24 0541  TROPONINIHS 112* 122* 114* 1,875*     Chemistry Recent Labs  Lab 05/18/24 1805 05/18/24 2221 05/19/24 0541  NA 140  --  141  K 3.8  --  3.7  CL 104  --  112*  CO2 22  --  21*  GLUCOSE 148*  --  138*  BUN 12  --  10  CREATININE 0.77  --  0.71  CALCIUM  9.1  --  8.5*  MG  --  2.1  --   GFRNONAA >60  --  >60  ANIONGAP 14  --  8    Recent Labs  Lab 05/18/24 1805  PROT 7.3  ALBUMIN 4.3  AST 42*  ALT 45*  ALKPHOS 74  BILITOT 0.8   Lipids No results for input(s): CHOL, TRIG, HDL, LABVLDL, LDLCALC, CHOLHDL in the last 168 hours.  Hematology Recent Labs  Lab 05/18/24 1805 05/19/24 0541  WBC 7.5 8.7  RBC 4.37 3.86*  HGB 14.5 12.7  HCT 41.9 36.1  MCV 95.9 93.5  MCH 33.2 32.9  MCHC 34.6 35.2  RDW 11.0* 11.1*  PLT 237 201   Thyroid   Recent Labs  Lab 05/18/24 2221  TSH 1.955    BNP Recent Labs  Lab 05/18/24 1805  BNP 74.0    DDimer No results for input(s): DDIMER in the last 168 hours.  Radiology/Studies:  CT Angio Chest PE W and/or Wo Contrast Result Date: 05/18/2024 CLINICAL DATA:  Pulmonary embolus suspected with high probability. Persistent upper mid chest pain. Pain and burning sensation. EXAM: CT ANGIOGRAPHY CHEST WITH CONTRAST TECHNIQUE: Multidetector CT imaging of the chest was performed using the standard protocol during bolus administration of intravenous contrast. Multiplanar CT image reconstructions and MIPs were obtained to evaluate the vascular anatomy. RADIATION DOSE REDUCTION: This exam was performed according to the departmental dose-optimization program which includes automated exposure control, adjustment of the  mA and/or kV according to patient size and/or use of iterative reconstruction  technique. CONTRAST:  75mL OMNIPAQUE  IOHEXOL  350 MG/ML SOLN COMPARISON:  Chest radiograph 05/18/2024 FINDINGS: Cardiovascular: Technically adequate study with good opacification of the central and segmental pulmonary arteries. Mild motion artifact. No focal filling defects are demonstrated. No evidence of significant pulmonary embolus. Normal caliber thoracic aorta. Scattered aortic and coronary artery calcification. No aneurysm or dissection. Heart size is normal. No pericardial effusions. Mediastinum/Nodes: No enlarged mediastinal, hilar, or axillary lymph nodes. Thyroid  gland, trachea, and esophagus demonstrate no significant findings. Lungs/Pleura: Patchy airspace changes in the lung bases may represent dependent atelectasis or edema. Multifocal pneumonia less likely based on appearance. No pleural effusion or pneumothorax. Upper Abdomen: Diffuse fatty infiltration of the liver. Cholelithiasis with large stone in the gallbladder. No acute abnormalities. Musculoskeletal: No chest wall abnormality. No acute or significant osseous findings. Review of the MIP images confirms the above findings. IMPRESSION: 1. No evidence of significant pulmonary embolus. 2. Hazy airspace disease in the lung bases likely represent dependent atelectasis or edema. 3. Fatty infiltration of the liver. 4. Cholelithiasis without evidence of acute cholecystitis. Electronically Signed   By: Elsie Gravely M.D.   On: 05/18/2024 21:10   DG Chest 2 View Result Date: 05/18/2024 CLINICAL DATA:  chest pain EXAM: CHEST - 2 VIEW COMPARISON:  05/14/2021 FINDINGS: No focal airspace consolidation, pleural effusion, or pneumothorax. No cardiomegaly.No acute fracture or destructive lesion. IMPRESSION: No acute cardiopulmonary abnormality. Electronically Signed   By: Rogelia Myers M.D.   On: 05/18/2024 17:48     Assessment and Plan:   1. NSTEMI - Presented with more frequent episodes of chest pain over the past several weeks and overall  atypical initially as typically triggered by food consumption but developed worsening symptoms over the past few days which have been occurring with minimal activity. - CTA negative for PE. Initial Hs Troponin values flat between 112 - 122 but rechecked this morning and significantly elevated at 1875. EKG does show TWI along the anterolateral leads which is more prominent when compared to prior tracings. FLP and echocardiogram pending. - Given her presentation and elevated enzymes, reviewed indications for cardiac catheterization for definitive evaluation with the patient. Also discussed with the patient's husband over the phone.  They are in agreement with proceeding. The patient understands that risks include but are not limited to stroke (1 in 1000), death (1 in 1000), kidney failure [usually temporary] (1 in 500), bleeding (1 in 200), allergic reaction [possibly serious] (1 in 200).  - She has been added to the cardiac catheterization scheduled for today. She has tolerated contrast in the past without any issues but does report having chest pain while receiving contrast for her CT scan last night. Will order preprocedure IV Benadryl  and Prednisone. Given that she received contrast yesterday as well, will order pre-procedure IV fluids. - Continue ASA 81 mg daily, Lopressor  12.5 mg twice daily, Crestor  20 mg daily and IV Heparin .  2. History of PE - Diagnosed in 11/2023 and Eliquis  discontinued in 02/2024 by Hematology. Had recently traveled 5 hours for a trip and had a COVID infection prior to her diagnosis. CTA this admission negative for PE.    3. HTN - BP was at 125/88 on most recent check. Continue Lopressor  12.5 mg twice daily. PTA Benazepril  20 mg daily and Spironolactone  25 mg daily currently on hold given she received contrast yesterday and is scheduled for cath today. Resume after her procedure.   4. HLD -  Will order a repeat FLP. Continue Crestor  20 mg daily.  5. Palpitations - Prior  monitor showed rare PAC's and PVC's with less than 1% burden. She is noted to have occasional PVC's on telemetry. Continue Lopressor  12.5 mg twice daily. Can likely switch back to PTA Toprol -XL at discharge.   Risk Assessment/Risk Scores:  TIMI Risk Score for Unstable Angina or Non-ST Elevation MI:   The patient's TIMI risk score is 4, which indicates a 20% risk of all cause mortality, new or recurrent myocardial infarction or need for urgent revascularization in the next 14 days.{  For questions or updates, please contact [facility] Please consult [email or web address] for contact info under   Signed, Laymon CHRISTELLA Qua, PA-C  05/19/2024 8:51 AM

## 2024-05-19 NOTE — Progress Notes (Signed)
 Progress Note   Patient: Ashlee Brown FMW:[id number] DOB: [birth year] DOA: 05/18/2024     1 DOS: the patient was seen and examined on 05/19/2024   Brief hospital admission narrative: As per H&P written by Dr. Keturah on 05/18/2024 Ashlee Brown is a 53 y.o. female with hx of CAD by imaging, HTN, HLD, Hx provoked PE, completed 3 months of AC, who presents with chest pain. Reports onset of R parasternal chest pain about 4 weeks ago, described as sharp, lasting for about 5 minutes. Mainly she noted after eating foods/ drinking liquid, noting worse with cold. She thought may be related to esophagitis. However, she has had escalating pattern and severity of pain, especially over the past 4 days. She has begun having exertional component to pain which is relieved with rest. Pain is severe 8/10. Relieved with NTG tab in the ED. She was not taking aspirin  prior to this admission, no hx of MI in the past.    Assessment and plan 1-NSTEMI -Troponin has continued trending up and most recent 1 up to 1875. - Continue heparin  drip - Continue n.p.o. status - Patient will be transferred to [facility] in anticipation for cardiac cath - Cardiology service aware and waiting for transfer for consultation. - Continue aspirin , metoprolol , statin and as needed nitroglycerin /morphine . - 2D echo has been ordered and will follow results.  2-hypertension - Stable and well-controlled - Continue treatment with metoprolol  and spironolactone  - Follow vital signs.  3-hyperlipidemia - Continue Crestor .  4-GERD - Continue PPI  5-prior history of provoked PE - Patient completed 87-month of anticoagulation and is currently not taking any meds for that - CT angiogram done during this admission demonstrated negative for pulmonary embolism.  6-overweight -Body mass index is 29.8 kg/m.  -Low-calorie diet, portion control and increase physical activity discussed with patient.  7-hyperglycemia - No prior history of  diabetes - Will check A1c - Continue to follow CBG fluctuation.  Subjective:  Afebrile, no nausea vomiting.  Reporting some chest discomfort.  Good saturation on room air appreciated on exam.  Physical Exam: Vitals:   05/19/24 0300 05/19/24 0330 05/19/24 0400 05/19/24 0430  BP: 106/75 114/72 121/85 125/88  Pulse: 62 68 69 70  Resp: 20 20 (!) 21 20  Temp:      TempSrc:      SpO2: 93% 92% 95% 96%  Weight:      Height:       General exam: Alert, awake, oriented x 3; reporting mild chest discomfort.  In no major distress. Respiratory system: Good saturation on room air; no wheezing or crackles. Cardiovascular system:RRR.  No rubs, no gallops, no murmur.  No JVD on exam. Gastrointestinal system: Abdomen is nondistended, soft and nontender. No organomegaly or masses felt. Normal bowel sounds heard. Central nervous system: Alert and oriented. No focal neurological deficits. Extremities: No C/C/E, +pedal pulses Skin: No rashes or petechiae. Psychiatry: Judgement and insight appear normal. Mood & affect appropriate.    Data Reviewed: High sensitive troponin: 112>> 122>> 114 >> 1875 Basic metabolic panel: Sodium 141, potassium 3.7, chloride 112, bicarb 21, BUN 10, creatinine 0.71 and GFR >60 CBC: WBCs 8.7, hemoglobin 12.7 and platelet count 201K  Family Communication: Husband contacted over the phone during visit.  Disposition: Status is: Inpatient Remains inpatient appropriate because: Continue IV therapy and further ischemic/Cardiologic workup  Anticipating discharge home once medically stable.  Time spent: 50 minutes  Author: Eric Nunnery, MD 05/19/2024 7:54 AM  For on call review [email or web address].

## 2024-05-19 NOTE — ED Notes (Signed)
Walked patient to the bathroom patient did well 

## 2024-05-19 NOTE — Telephone Encounter (Signed)
Patient Product/process development scientist completed.    The patient is insured through [facility]. Patient has [employer], may use a copay card, and/or apply for patient assistance if available.    Ran test claim for Farxiga 10 mg and the current 30 day co-pay is $0.00.  Ran test claim for Jardiance 10 mg and the current 30 day co-pay is $0.00.  This test claim was processed through [facility]- copay amounts may vary at other pharmacies due to pharmacy/plan contracts, or as the patient moves through the different stages of their insurance plan.     Roland Earl, CPHT Pharmacy Technician III Certified Patient Advocate [facility] Patient Advocate Team Direct Number: [phone]  Fax: [phone]

## 2024-05-19 NOTE — ED Notes (Signed)
Got patient on the monitor patient is resting with call bell in reach  ?

## 2024-05-19 NOTE — ED Notes (Signed)
 Lab called a critical Troponin 1 (2,601) at 1226. Ashlee Brown (PA) cardiology was notify at 12:39 pm.

## 2024-05-19 NOTE — Interval H&P Note (Signed)
 History and Physical Interval Note:  05/19/2024 3:22 PM  Ashlee Brown  has presented today for surgery, with the diagnosis of nstemi.  The various methods of treatment have been discussed with the patient and family. After consideration of risks, benefits and other options for treatment, the patient has consented to  Procedure(s): LEFT HEART CATH AND CORONARY ANGIOGRAPHY (N/A) as a surgical intervention.  The patient's history has been reviewed, patient examined, no change in status, stable for surgery.  I have reviewed the patient's chart and labs.  Questions were answered to the patient's satisfaction.     Michela Herst J Dominico Rod

## 2024-05-19 NOTE — Progress Notes (Signed)
 PHARMACY - ANTICOAGULATION CONSULT NOTE  Pharmacy Consult for heparin  Indication: chest pain/ACS  Allergies  Allergen Reactions   Sulfamethoxazole Other (See Comments)    Blood clots   Trimethoprim Other (See Comments)    Blood clots   Escitalopram Oxalate Other (See Comments)    Unknown    Lisinopril Other (See Comments)    Unknown    Naltrexone-Bupropion Hcl Er Other (See Comments)    Unknown     Patient Measurements: Height: 5' 2 (157.5 cm) Weight: 73.9 kg (162 lb 14.7 oz) IBW/kg (Calculated) : 50.1 HEPARIN  DW (KG): 66  Vital Signs: Temp: 98.2 F (36.8 C) (09/26 0923) Temp Source: Oral (09/26 0923) BP: 125/84 (09/26 1045) Pulse Rate: 73 (09/26 1045)  Labs: Recent Labs    05/18/24 1805 05/18/24 2038 05/18/24 2221 05/19/24 0541 05/19/24 1131  HGB 14.5  --   --  12.7  --   HCT 41.9  --   --  36.1  --   PLT 237  --   --  201  --   APTT 25  --   --  60*  --   LABPROT 12.4  --   --   --   --   INR 0.9  --   --   --   --   HEPARINUNFRC  --   --  <0.10* 0.31 0.25*  CREATININE 0.77  --   --  0.71  --   TROPONINIHS 112*   < > 114* 1,875* 2,601*   < > = values in this interval not displayed.    Estimated Creatinine Clearance: 76.5 mL/min (by C-G formula based on SCr of 0.71 mg/dL).   Medical History: Past Medical History:  Diagnosis Date   Hyperlipidemia    Hypertension     Medications:  Infusions:   sodium chloride  100 mL/hr at 05/19/24 1142   heparin  700 Units/hr (05/18/24 2221)    Assessment: 52 yof presented to the ED with CP. Started on IV heparin . Heparin  level is now subtherapeutic.   Goal of Therapy:  Heparin  level 0.3-0.7 units/ml Monitor platelets by anticoagulation protocol: Yes   Plan:  Increase heparin  gtt 900 units/hr Check a 6 hr heparin  level Daily heparin  level and CBC   Charon Smedberg, Vernell Helling 05/19/2024,1:12 PM

## 2024-05-20 ENCOUNTER — Inpatient Hospital Stay (HOSPITAL_COMMUNITY)

## 2024-05-20 DIAGNOSIS — E119 Type 2 diabetes mellitus without complications: Secondary | ICD-10-CM | POA: Diagnosis not present

## 2024-05-20 DIAGNOSIS — I10 Essential (primary) hypertension: Secondary | ICD-10-CM | POA: Diagnosis not present

## 2024-05-20 DIAGNOSIS — I97820 Postprocedural cerebrovascular infarction during cardiac surgery: Secondary | ICD-10-CM

## 2024-05-20 DIAGNOSIS — R200 Anesthesia of skin: Secondary | ICD-10-CM

## 2024-05-20 DIAGNOSIS — I214 Non-ST elevation (NSTEMI) myocardial infarction: Secondary | ICD-10-CM

## 2024-05-20 LAB — LIPID PANEL
Cholesterol: 132 mg/dL (ref 0–200)
HDL: 31 mg/dL — ABNORMAL LOW (ref >40)
LDL Cholesterol: 72 mg/dL (ref 0–99)
Total CHOL/HDL Ratio: 4.3 ratio
Triglycerides: 146 mg/dL (ref <150)
VLDL: 29 mg/dL (ref 0–40)

## 2024-05-20 LAB — ECHOCARDIOGRAM COMPLETE
AR max vel: 2.63 cm2
AV Peak grad: 3.8 mmHg
Ao pk vel: 0.97 m/s
Area-P 1/2: 4.39 cm2
Calc EF: 39.4 %
Height: 62 in
S' Lateral: 3.5 cm
Single Plane A2C EF: 40.5 %
Single Plane A4C EF: 40.4 %
Weight: 2606.72 [oz_av]

## 2024-05-20 LAB — GLUCOSE, CAPILLARY
Glucose-Capillary: 131 mg/dL — ABNORMAL HIGH (ref 70–99)
Glucose-Capillary: 199 mg/dL — ABNORMAL HIGH (ref 70–99)

## 2024-05-20 LAB — CBC
HCT: 37 % (ref 36.0–46.0)
Hemoglobin: 13 g/dL (ref 12.0–15.0)
MCH: 32.2 pg (ref 26.0–34.0)
MCHC: 35.1 g/dL (ref 30.0–36.0)
MCV: 91.6 fL (ref 80.0–100.0)
Platelets: 217 K/uL (ref 150–400)
RBC: 4.04 MIL/uL (ref 3.87–5.11)
RDW: 10.9 % — ABNORMAL LOW (ref 11.5–15.5)
WBC: 10.4 K/uL (ref 4.0–10.5)
nRBC: 0 % (ref 0.0–0.2)

## 2024-05-20 LAB — BASIC METABOLIC PANEL WITH GFR
Anion gap: 14 (ref 5–15)
BUN: 8 mg/dL (ref 6–20)
CO2: 18 mmol/L — ABNORMAL LOW (ref 22–32)
Calcium: 9 mg/dL (ref 8.9–10.3)
Chloride: 106 mmol/L (ref 98–111)
Creatinine, Ser: 0.83 mg/dL (ref 0.44–1.00)
GFR, Estimated: >60 mL/min (ref >60)
Glucose, Bld: 127 mg/dL — ABNORMAL HIGH (ref 70–99)
Potassium: 3.7 mmol/L (ref 3.5–5.1)
Sodium: 138 mmol/L (ref 135–145)

## 2024-05-20 MED ORDER — STROKE: EARLY STAGES OF RECOVERY BOOK
Freq: Once | Status: AC
  Filled 2024-05-20: qty 1

## 2024-05-20 MED ORDER — METOPROLOL TARTRATE 25 MG PO TABS
25.0000 mg | ORAL_TABLET | Freq: Two times a day (BID) | ORAL | Status: DC
Start: 2024-05-20 — End: 2024-05-22
  Administered 2024-05-20 – 2024-05-22 (×3): 25 mg via ORAL
  Filled 2024-05-20 (×3): qty 1

## 2024-05-20 MED ORDER — INSULIN ASPART 100 UNIT/ML IJ SOLN
0.0000 [IU] | Freq: Three times a day (TID) | INTRAMUSCULAR | Status: DC
  Administered 2024-05-20 – 2024-05-22 (×2): 2 [IU] via SUBCUTANEOUS

## 2024-05-20 MED ORDER — PERFLUTREN LIPID MICROSPHERE
1.0000 mL | INTRAVENOUS | Status: AC | PRN
  Administered 2024-05-20: 1 mL via INTRAVENOUS

## 2024-05-20 MED ORDER — STERILE WATER FOR INJECTION IJ SOLN
INTRAMUSCULAR | Status: AC
  Filled 2024-05-20: qty 10

## 2024-05-20 MED ORDER — LORAZEPAM 2 MG/ML IJ SOLN
0.5000 mg | Freq: Once | INTRAMUSCULAR | Status: AC
  Administered 2024-05-20: 0.5 mg via INTRAVENOUS
  Filled 2024-05-20: qty 1

## 2024-05-20 MED ORDER — HYDROXYZINE HCL 25 MG PO TABS
25.0000 mg | ORAL_TABLET | Freq: Three times a day (TID) | ORAL | Status: DC | PRN
  Administered 2024-05-20 (×3): 25 mg via ORAL
  Filled 2024-05-20 (×3): qty 1

## 2024-05-20 MED ORDER — INSULIN ASPART 100 UNIT/ML IJ SOLN: 0.0000 [IU] | Freq: Every day | INTRAMUSCULAR | Status: DC

## 2024-05-20 MED ORDER — LORAZEPAM 2 MG/ML IJ SOLN
1.0000 mg | Freq: Once | INTRAMUSCULAR | Status: AC
  Administered 2024-05-20: 1 mg via INTRAVENOUS
  Filled 2024-05-20: qty 1

## 2024-05-20 MED ORDER — ASPIRIN 81 MG PO CHEW
81.0000 mg | CHEWABLE_TABLET | Freq: Every day | ORAL | Status: DC
  Administered 2024-05-20 – 2024-05-22 (×3): 81 mg via ORAL
  Filled 2024-05-20 (×3): qty 1

## 2024-05-20 NOTE — Progress Notes (Signed)
 Progress Note   Patient: Ashlee Brown FMW:[id number] DOB: 07-Feb-1971 DOA: 05/18/2024     2 DOS: the patient was seen and examined on 05/20/2024   Brief hospital course: Shaquella Stamant is a 53 y.o. female with hx of CAD by imaging, HTN, HLD, Hx provoked PE, completed 3 months of AC, who presents with chest pain. Reports onset of R parasternal chest pain about 4 weeks ago, described as sharp, lasting for about 5 minutes. Mainly she noted after eating foods/ drinking liquid, noting worse with cold. She thought may be related to esophagitis. However, she has had escalating pattern and severity of pain, especially over the past 4 days. She has begun having exertional component to pain which is relieved with rest. Pain is severe 8/10. Relieved with NTG tab in the ED. She was not taking aspirin  prior to this admission, no hx of MI in the past.   Assessment and Plan: NSTEMI S/p cardiac cath- mid LAD stents placed 05/19/24. Continue aspirin , brillinta, beta blocker, statin therapy. Cardiology team advised neuro consultation due to her left sided sensory deficits.  Left-sided sensory deficits- She has left arm numbness since post catheterization procedure. CT head ordered unremarkable. Neurology team consulted.  New onset diabetes- A1c 7.3. Accu-Cheks, sliding scale ordered. Diabetes educator consulted. Diet changed to heart healthy carb consistent diet.   Hypertension hold Continue treatment with metoprolol   Will resume benazepril  and spironolactone  if BP elevated.   Hyperlipidemia Continue Crestor .   GERD Continue PPI   H/o of provoked PE CT angiogram done this admission in [facility] facility demonstrated negative for pulmonary embolism. She finished 3 months of anticoagulation.        Out of bed to chair. Incentive spirometry. Nursing supportive care. Fall, aspiration precautions. Diet:  Diet Orders (From admission, onward)     Start     Ordered   05/19/24 1646  Diet Heart Room service  appropriate? Yes; Fluid consistency: Thin  Diet effective now       Question Answer Comment  Room service appropriate? Yes   Fluid consistency: Thin      05/19/24 1645           DVT prophylaxis: SCD's Start: 05/19/24 1646  Level of care: Progressive Cardiac   Code Status: Full Code  Subjective: Patient is seen and examined today morning. She has left arm numbness. Also have left leg heavy feeling. Has no facial weakness, slurred speech. Symptoms since yesterday after cath. No headache, blurred vision. No chest pain. Able to walk with support per RN.  Physical Exam: Vitals:   05/20/24 0900 05/20/24 1000 05/20/24 1100 05/20/24 1200  BP: 111/78 117/86 (!) 152/97 (!) 143/99  Pulse: 74 (!) 105 (!) 115 89  Resp: (!) 23 15 16  (!) 21  Temp:      TempSrc:      SpO2: 95% 97% 98% 97%  Weight:      Height:        General - Middle aged Caucasian female, no apparent distress HEENT - PERRLA, EOMI, atraumatic head, non tender sinuses. Lung - Clear, no rales, rhonchi, wheezes. Heart - S1, S2 heard, no murmurs, rubs, trace pedal edema. Abdomen - Soft, non tender, bowel sounds good Neuro - Alert, awake and oriented x 3, non focal exam. Skin - Warm and dry.  Data Reviewed:      Latest Ref Rng & Units 05/20/2024    3:28 AM 05/19/2024    5:41 AM 05/18/2024    6:05 PM  CBC  WBC  4.0 - 10.5 K/uL 10.4  8.7  7.5   Hemoglobin 12.0 - 15.0 g/dL 86.9  87.2  85.4   Hematocrit 36.0 - 46.0 % 37.0  36.1  41.9   Platelets 150 - 400 K/uL 217  201  237       Latest Ref Rng & Units 05/20/2024    3:28 AM 05/19/2024    5:41 AM 05/18/2024    6:05 PM  BMP  Glucose 70 - 99 mg/dL 872  861  851   BUN 6 - 20 mg/dL 8  10  12    Creatinine 0.44 - 1.00 mg/dL 9.16  9.28  9.22   Sodium 135 - 145 mmol/L 138  141  140   Potassium 3.5 - 5.1 mmol/L 3.7  3.7  3.8   Chloride 98 - 111 mmol/L 106  112  104   CO2 22 - 32 mmol/L 18  21  22    Calcium  8.9 - 10.3 mg/dL 9.0  8.5  9.1    CT HEAD WO CONTRAST ( ) Result  Date: 05/20/2024 EXAM: CT HEAD WITHOUT CONTRAST 05/20/2024 11:37:56 AM TECHNIQUE: CT of the head was performed without the administration of intravenous contrast. Automated exposure control, iterative reconstruction, and/or weight based adjustment of the mA/kV was utilized to reduce the radiation dose to as low as reasonably achievable. COMPARISON: None available. CLINICAL HISTORY: Neuro deficit, acute, stroke suspected. NSTEMI yesterday. PTCA to the LAD yesterday. FINDINGS: BRAIN AND VENTRICLES: No acute hemorrhage. No evidence of acute infarct. No hydrocephalus. No extra-axial collection. No mass effect or midline shift. ORBITS: No acute abnormality. SINUSES: No acute abnormality. SOFT TISSUES AND SKULL: No acute soft tissue abnormality. No skull fracture. IMPRESSION: 1. No acute intracranial abnormality. Electronically signed by: Lonni Necessary MD 05/20/2024 11:51 AM EDT RP Workstation: [id number]   CARDIAC CATHETERIZATION Addendum Date: 05/19/2024 Coronary intervention 05/19/2024: LM: Normal LAD: Mid focal 99% stenosis          Diffuse 50 to 70% disease in mid LAD, distal apical focal 80% stenosis          Small caliber bifurcating diagonal 1 with ostial 80% stenosis Lcx: Large dominant vessel with proximal 20%, OM1 30% disease RCA: Small nondominant vessel, no significant disease LVEDP 19 mmHg Successful percutaneous coronary intervention mid LAD        PTCA and stent placement 3.5 X 20 mm & 3.0 X 20 mm Synergy drug-eluting stents        Post dilatation using 3.0 X 20 and 3.5 X 12 mm [place] balloons up to 20 atm        IVUS with excellent stent expansion and apposition, no stent edge dissection        Recommend medical management for rest of the disease Newman JINNY Lawrence, MD   Result Date: 05/19/2024 Images from the original result were not included. Coronary intervention 05/19/2024: LM: Normal LAD: Mid focal 99% stenosis          Diffuse 50 to 70% disease in mid LAD, distal apical focal 80% stenosis           Small caliber bifurcating diagonal 1 with ostial 80% stenosis Lcx: Large dominant vessel with proximal 20%, OM1 30% disease RCA: Small nondominant vessel, no significant disease LVEDP 19 mmHg Successful percutaneous coronary intervention mid LAD        PTCA and stent placement 3.5 X 20 mm & 3.0 X 20 mm Synergy drug-eluting stents        Post dilatation using 3.0 X  20 and 3.5 X 12 mm [place] balloons up to 20 atm        IVUS with excellent stent expansion and apposition, no stent edge dissection        Recommend medical management for rest of the disease Newman JINNY Lawrence, MD   CT Angio Chest PE W and/or Wo Contrast Result Date: 05/18/2024 CLINICAL DATA:  Pulmonary embolus suspected with high probability. Persistent upper mid chest pain. Pain and burning sensation. EXAM: CT ANGIOGRAPHY CHEST WITH CONTRAST TECHNIQUE: Multidetector CT imaging of the chest was performed using the standard protocol during bolus administration of intravenous contrast. Multiplanar CT image reconstructions and MIPs were obtained to evaluate the vascular anatomy. RADIATION DOSE REDUCTION: This exam was performed according to the departmental dose-optimization program which includes automated exposure control, adjustment of the mA and/or kV according to patient size and/or use of iterative reconstruction technique. CONTRAST:  75mL OMNIPAQUE  IOHEXOL  350 MG/ML SOLN COMPARISON:  Chest radiograph 05/18/2024 FINDINGS: Cardiovascular: Technically adequate study with good opacification of the central and segmental pulmonary arteries. Mild motion artifact. No focal filling defects are demonstrated. No evidence of significant pulmonary embolus. Normal caliber thoracic aorta. Scattered aortic and coronary artery calcification. No aneurysm or dissection. Heart size is normal. No pericardial effusions. Mediastinum/Nodes: No enlarged mediastinal, hilar, or axillary lymph nodes. Thyroid  gland, trachea, and esophagus demonstrate no significant findings.  Lungs/Pleura: Patchy airspace changes in the lung bases may represent dependent atelectasis or edema. Multifocal pneumonia less likely based on appearance. No pleural effusion or pneumothorax. Upper Abdomen: Diffuse fatty infiltration of the liver. Cholelithiasis with large stone in the gallbladder. No acute abnormalities. Musculoskeletal: No chest wall abnormality. No acute or significant osseous findings. Review of the MIP images confirms the above findings. IMPRESSION: 1. No evidence of significant pulmonary embolus. 2. Hazy airspace disease in the lung bases likely represent dependent atelectasis or edema. 3. Fatty infiltration of the liver. 4. Cholelithiasis without evidence of acute cholecystitis. Electronically Signed   By: Elsie Gravely M.D.   On: 05/18/2024 21:10   DG Chest 2 View Result Date: 05/18/2024 CLINICAL DATA:  chest pain EXAM: CHEST - 2 VIEW COMPARISON:  05/14/2021 FINDINGS: No focal airspace consolidation, pleural effusion, or pneumothorax. No cardiomegaly.No acute fracture or destructive lesion. IMPRESSION: No acute cardiopulmonary abnormality. Electronically Signed   By: Rogelia Myers M.D.   On: 05/18/2024 17:48    Family Communication: Discussed with patient, spouse at bedside. They understand and agree. All questions answered.  Disposition: Status is: Inpatient Remains inpatient appropriate because: post cath, neuro deficits  Planned Discharge Destination: Home with Home Health     Time spent: 53 minutes  Author: Concepcion Riser, MD 05/20/2024 2:18 PM Secure chat 7am to 7pm For on call review [email or web address].

## 2024-05-20 NOTE — Plan of Care (Signed)

## 2024-05-20 NOTE — Progress Notes (Signed)
 CARDIAC REHAB PHASE I   Pt off the floor for CT. Brief education reviewed with husband. Written post MI/stent materials given to husband. Referral for CRP2 sent to [facility]. Will continue to follow.   Vaughn Asberry Hacking, RN BSN 05/20/2024 11:57 AM

## 2024-05-20 NOTE — Consult Note (Addendum)
 NEUROLOGY CONSULT NOTE   Date of service: May 20, 2024 Patient Name: Ashlee Brown MRN:  [id number] DOB:  [birth year] Chief Complaint: stroke Requesting Provider: Darci Pore, MD  History of Present Illness  Ashlee Brown is a 53 y.o. female with hx of CAD, HTN, HLD, PE s/p 3 months of AC who initially presented with chest pain and admitted for NSTEMI.  She underwent PCI with DES to mid LAD.  Postprocedure, she developed tingling in her fingers on the left hand but was still able to walk laps around the unit without any issues.  Given persistent left-sided numbness and weakness, a CT head was obtained and neurology was consulted.  We recommended getting an MRI of the brain which demonstrated a right lateral thalamus infarct.  Patient denies any prior history of strokes, her maternal aunt had a stroke.  She does not smoke, does not drink alcohol, does not use any recreational substances.  She endorses a history of prediabetes.  She endorses history of hypertension but reports her blood pressure is consistently in 120 systolic at home.  She also endorses a history of hyperlipidemia and reports compliance with rosuvastatin .  She denies any history of atrial fibrillation.  She feels she is steadily improving and her hand feels less clumsy today.  LKW: 1530 on 05/19/2024 Modified rankin score: 0-Completely asymptomatic and back to baseline post- stroke IV Thrombolysis: Not offered, outside the window to mild to treat.   EVT: Not offered, low suspicion for an LVO.  NIHSS components Score: Comment  1a Level of Conscious 0[]  1[]  2[]  3[]      1b LOC Questions 0[]  1[]  2[]       1c LOC Commands 0[]  1[]  2[]       2 Best Gaze 0[]  1[]  2[]       3 Visual 0[]  1[]  2[]  3[]      4 Facial Palsy 0[]  1[]  2[]  3[]      5a Motor Arm - left 0[]  1[]  2[]  3[]  4[]  UN[]    5b Motor Arm - Right 0[]  1[]  2[]  3[]  4[]  UN[]    6a Motor Leg - Left 0[]  1[]  2[]  3[]  4[]  UN[]    6b Motor Leg - Right 0[]  1[]  2[]  3[]  4[]   UN[]    7 Limb Ataxia 0[]  1[]  2[]  UN[]      8 Sensory 0[]  1[]  2[]  UN[]      9 Best Language 0[]  1[]  2[]  3[]      10 Dysarthria 0[]  1[]  2[]  UN[]      11 Extinct. and Inattention 0[]  1[]  2[]       TOTAL: 0      ROS  Comprehensive ROS performed and pertinent positives documented in HPI   Past History   Past Medical History:  Diagnosis Date   Hyperlipidemia    Hypertension     Past Surgical History:  Procedure Laterality Date   APPENDECTOMY     CESAREAN SECTION     LEG SURGERY Left     Family History: Family History  Problem Relation Age of Onset   Hypertension Mother    Hyperlipidemia Mother     Social History  reports that she has never smoked. She has never been exposed to tobacco smoke. She has never used smokeless tobacco. She reports current alcohol use. She reports that she does not use drugs.  Allergies  Allergen Reactions   Sulfamethoxazole Other (See Comments)    Blood clots   Trimethoprim Other (See Comments)    Blood clots   Escitalopram Oxalate Other (See Comments)    Unknown  Lisinopril Other (See Comments)    Unknown    Naltrexone-Bupropion Hcl Er Other (See Comments)    Unknown     Medications   Current Facility-Administered Medications:    acetaminophen  (TYLENOL ) tablet 1,000 mg, 1,000 mg, Oral, Q6H PRN, Segars, Jonathan, MD, 1,000 mg at 05/18/24 2251   albuterol  (PROVENTIL ) (2.5 MG/3ML) 0.083% nebulizer solution 2.5 mg, 2.5 mg, Nebulization, Q4H PRN, Segars, Jonathan, MD   aspirin  chewable tablet 81 mg, 81 mg, Oral, Daily, Bitonti, Michael T, RPH, 81 mg at 05/20/24 1620   Chlorhexidine  Gluconate Cloth 2 % PADS 6 each, 6 each, Topical, Daily, Sreeram, Narendranath, MD, 6 each at 05/20/24 1029   hydrOXYzine (ATARAX) tablet 25 mg, 25 mg, Oral, TID PRN, Donnel Rima, MD, 25 mg at 05/20/24 1027   insulin aspart (novoLOG) injection 0-15 Units, 0-15 Units, Subcutaneous, TID WC, Sreeram, Narendranath, MD, 2 Units at 05/20/24 1742   insulin aspart  (novoLOG) injection 0-5 Units, 0-5 Units, Subcutaneous, QHS, Sreeram, Narendranath, MD   melatonin tablet 6 mg, 6 mg, Oral, QHS PRN, Segars, Jonathan, MD   metoprolol  tartrate (LOPRESSOR ) tablet 25 mg, 25 mg, Oral, BID, Skains, Mark C, MD   morphine  (PF) 2 MG/ML injection 2 mg, 2 mg, Intravenous, Q4H PRN, Segars, Jonathan, MD   morphine  (PF) 4 MG/ML injection 4 mg, 4 mg, Intravenous, Once, Dean Clarity, MD   nitroGLYCERIN  (NITROGLYN) 2 % ointment 0.5 inch, 0.5 inch, Topical, Q6H PRN, Segars, Jonathan, MD   nitroGLYCERIN  (NITROSTAT ) SL tablet 0.4 mg, 0.4 mg, Sublingual, Q5 min PRN, Haviland, Julie, MD, 0.4 mg at 05/18/24 2252   ondansetron  (ZOFRAN ) injection 4 mg, 4 mg, Intravenous, Once, Haviland, Julie, MD   ondansetron  (ZOFRAN ) injection 4 mg, 4 mg, Intravenous, Q6H PRN, Segars, Jonathan, MD   pantoprazole  (PROTONIX ) EC tablet 40 mg, 40 mg, Oral, Daily, Segars, Jonathan, MD, 40 mg at 05/20/24 1027   polyethylene glycol (MIRALAX  / GLYCOLAX ) packet 17 g, 17 g, Oral, Daily PRN, Segars, Jonathan, MD   rosuvastatin  (CRESTOR ) tablet 20 mg, 20 mg, Oral, QHS, Segars, Jonathan, MD, 20 mg at 05/19/24 2207   sodium chloride  flush (NS) 0.9 % injection 3 mL, 3 mL, Intravenous, Q12H, Segars, Jonathan, MD, 3 mL at 05/20/24 1029   sodium chloride  flush (NS) 0.9 % injection 3 mL, 3 mL, Intravenous, Q12H, Patwardhan, Manish J, MD, 3 mL at 05/20/24 1029   sodium chloride  flush (NS) 0.9 % injection 3 mL, 3 mL, Intravenous, PRN, Patwardhan, Manish J, MD   ticagrelor  (BRILINTA ) tablet 90 mg, 90 mg, Oral, BID, Patwardhan, Manish J, MD, 90 mg at 05/20/24 1027  Vitals   Vitals:   05/20/24 1830 05/20/24 1900 05/20/24 1930 05/20/24 2000  BP: (!) 125/95 105/80 112/81   Pulse: (!) 107 91 93 (!) 115  Resp: 14 (!) 25 (!) 21 18  Temp:    97.9 F (36.6 C)  TempSrc:    Oral  SpO2: 96% 94% 93% 96%  Weight:      Height:        Body mass index is 29.8 kg/m.   Physical Exam   General: Laying comfortably in bed;  in no acute distress.  HENT: Normal oropharynx and mucosa. Normal external appearance of ears and nose.  Neck: Supple, no pain or tenderness  CV: No JVD. No peripheral edema.  Pulmonary: Symmetric Chest rise. Normal respiratory effort.  Abdomen: Soft to touch, non-tender.  Ext: No cyanosis, edema, or deformity  Skin: No rash. Normal palpation of skin.   Musculoskeletal: Normal  digits and nails by inspection. No clubbing.   Neurologic Examination  Mental status/Cognition: Alert, oriented to self, place, month and year, good attention.  Speech/language: Fluent, comprehension intact, object naming intact, repetition intact.  Cranial nerves:   CN II Pupils equal and reactive to light, no VF deficits    CN III,IV,VI EOM intact, no gaze preference or deviation, no nystagmus    CN V normal sensation in V1, V2, and V3 segments bilaterally    CN VII no asymmetry, no nasolabial fold flattening    CN VIII normal hearing to speech    CN IX & X normal palatal elevation, no uvular deviation    CN XI 5/5 head turn and 5/5 shoulder shrug bilaterally    CN XII midline tongue protrusion    Motor:  Muscle bulk: Normal, tone normal, pronator drift noted in left upper extremity.  Tremor none Mvmt Root Nerve  Muscle Right Left Comments  SA C5/6 Ax Deltoid 5 5   EF C5/6 Mc Biceps 5 5   EE C6/7/8 Rad Triceps 5 5   WF C6/7 Med FCR     WE C7/8 PIN ECU     F Ab C8/T1 U ADM/FDI 5 5   HF L1/2/3 Fem Illopsoas 5 4+   KE L2/3/4 Fem Quad 5 5   DF L4/5 D Peron Tib Ant 5 5   PF S1/2 Tibial Grc/Sol 5 5    Sensation:  Light touch Intact throughout   Pin prick    Temperature    Vibration   Proprioception    Coordination/Complex Motor:  - Finger to Nose intact bilaterally - Heel to shin intact bilaterally - Rapid alternating movement are slowed in left lower extremity. - Gait: Deferred for patient safety. Labs/Imaging/Neurodiagnostic studies   CBC:  Recent Labs  Lab 06/15/24 1805 05/19/24 0541  05/20/24 0328  WBC 7.5 8.7 10.4  NEUTROABS 3.9  --   --   HGB 14.5 12.7 13.0  HCT 41.9 36.1 37.0  MCV 95.9 93.5 91.6  PLT 237 201 217   Basic Metabolic Panel:  Lab Results  Component Value Date   NA 138 05/20/2024   K 3.7 05/20/2024   CO2 18 (L) 05/20/2024   GLUCOSE 127 (H) 05/20/2024   BUN 8 05/20/2024   CREATININE 0.83 05/20/2024   CALCIUM  9.0 05/20/2024   GFRNONAA >60 05/20/2024   Lipid Panel:  Lab Results  Component Value Date   LDLCALC 72 05/20/2024   HgbA1c:  Lab Results  Component Value Date   HGBA1C 7.3 (H) 05/19/2024   Urine Drug Screen: No results found for: LABOPIA, COCAINSCRNUR, LABBENZ, AMPHETMU, THCU, LABBARB  Alcohol Level No results found for: [facility] INR  Lab Results  Component Value Date   INR 0.9 06-15-24   APTT  Lab Results  Component Value Date   APTT 60 (H) 05/19/2024   AED levels: No results found for: PHENYTOIN, ZONISAMIDE, LAMOTRIGINE, LEVETIRACETA  CT Head without contrast(Personally reviewed): CTH was negative for a large hypodensity concerning for a large territory infarct or hyperdensity concerning for an ICH  CT angio Head and Neck with contrast(Personally reviewed): Pending  MRI Brain(Personally reviewed): Right lateral thalamic stroke.  MRI of the cervical spine(personally reviewed): 1. Broad-based disc osteophyte complex at C4-5 distorting the ventral surface of the cord and narrowing the canal to 6 mm, with moderate foraminal stenosis bilaterally. No cord signal abnormality is present. 2. Broad-based disc osteophyte at C5-6 partially effacing the ventral CSF, with severe right and mild left  foraminal stenosis. 3. Shallow soft disc protrusion at C6-7 without significant stenosis. 4. Uncovertebral and facet hypertrophy at C3-4 with moderate foraminal narrowing bilaterally.   ASSESSMENT   Ashlee Brown is a 53 y.o. female admitted with NSTEMI/chest pain and underwent PCI with DES to mid LAD.   Postprocedure, developed some left hand numbness and slight left leg weakness.  Found to have a right lateral thalamic infarct on MRI of the brain.  Etiology of the infarct is likely postprocedural, embolic in the setting of PCI or possibly small vessel.  RECOMMENDATIONS  - Frequent Neuro checks per stroke unit protocol - Recommend Vascular imaging with CT angio head and neck. -I do not think a TTE is warranted as she just had coronary angiogram. - Recommend obtaining Lipid panel with LDL - Please start statin if LDL > 70 - Recommend HbA1c to evaluate for diabetes and how well it is controlled. - Antithrombotic -continue Aspirin  and Brilinta  per cardiology. - Recommend DVT ppx - SBP goal - permissive hypertension first 24 h < 220/110. Held home meds.  - Recommend Telemetry monitoring for arrythmia - Recommend bedside swallow screen prior to PO intake. - Stroke education booklet - Recommend PT/OT/SLP consult  - Follow-up with neurosurgery outpatient for noted cervical spine degenerative disc disease. ______________________________________________________________________  I personally spent a total of 80 minutes in the care of the patient today including preparing to see the patient, getting/reviewing separately obtained history, performing a medically appropriate exam/evaluation, counseling and educating, placing orders, documenting clinical information in the EHR, independently interpreting results, communicating results, and coordinating care.   Signed, Sakura Denis, MD [facility]

## 2024-05-20 NOTE — Plan of Care (Signed)
   Problem: Activity: Goal: Risk for activity intolerance will decrease Outcome: Progressing   Problem: Nutrition: Goal: Adequate nutrition will be maintained Outcome: Progressing   Problem: Safety: Goal: Ability to remain free from injury will improve Outcome: Progressing

## 2024-05-20 NOTE — Progress Notes (Signed)
 Echocardiogram 2D Echocardiogram has been performed.  Ashlee Brown N Ephrem Carrick,RDCS 05/20/2024, 2:44 PM

## 2024-05-20 NOTE — Progress Notes (Signed)
  Progress Note  Patient Name: Ashlee Brown Date of Encounter: 05/20/2024 [facility] Cardiologist: Jayson Sierras, MD   Interval Summary   She is with her husband at bedside.  She was able to ambulate the hallway however she is still stating that the left side of her body is feeling different.  Head CT was performed earlier today and was unremarkable.    Vital Signs Vitals:   05/20/24 0900 05/20/24 1000 05/20/24 1100 05/20/24 1200  BP: 111/78 117/86 (!) 152/97 (!) 143/99  Pulse: 74 (!) 105 (!) 115 89  Resp: (!) 23 15 16  (!) 21  Temp:      TempSrc:      SpO2: 95% 97% 98% 97%  Weight:      Height:        Intake/Output Summary (Last 24 hours) at 05/20/2024 1351 Last data filed at 05/20/2024 0345 Gross per 24 hour  Intake 1260.04 ml  Output 625 ml  Net 635.04 ml      05/18/2024    4:51 PM 04/20/2024   11:47 AM 04/19/2024   10:38 AM  Last 3 Weights  Weight (lbs) 162 lb 14.7 oz 162 lb 14.7 oz 166 lb 9.6 oz  Weight (kg) 73.9 kg 73.9 kg 75.569 kg      Telemetry/ECG  Sinus tachycardia - Personally Reviewed  Physical Exam  GEN: No acute distress.   Neck: No JVD Cardiac: RRR, no murmurs, rubs, or gallops.  Respiratory: Clear to auscultation bilaterally. GI: Soft, nontender, non-distended  MS: No edema  Assessment & Plan  53 year old female with non-ST myocardial infarction brought to the catheterization lab and stent placed in LAD.  Non-STEMI/CAD -Peak troponin 2800 Hemoglobin 14.5, creatinine 0.7 BNP 74 -We will increase metoprolol  to tartrate to 25 mg twice a day Continue with Brilinta  90 mg twice a day, aspirin  81 mg a day, Crestor  20 mg a day with LDL goal of less than 55, cardiac rehab - Echocardiogram currently being performed.   Coronary intervention 05/19/2024: LM: Normal LAD: Mid focal 99% stenosis          Diffuse 50 to 70% disease in mid LAD, distal apical focal 80% stenosis          Small caliber bifurcating diagonal 1 with ostial 80%  stenosis Lcx: Large dominant vessel with proximal 20%, OM1 30% disease RCA: Small nondominant vessel, no significant disease   LVEDP 19 mmHg   Successful percutaneous coronary intervention mid LAD        PTCA and stent placement 3.5 X 20 mm & 3.0 X 20 mm Synergy drug-eluting stents        Post dilatation using 3.0 X 20 and 3.5 X 12 mm [place] balloons up to 20 atm        IVUS with excellent stent expansion and apposition, no stent edge dissection        Recommend medical management for rest of the disease      Manish JINNY Lawrence, MD   Patient is concerned about possible TIA with left-sided sensory changes post catheterization - I have contacted Dr. Darci, attending.  Neurology consult will take place.     For questions or updates, please contact [facility] Please consult [email or web address] for contact info under         Signed, Oneil Parchment, MD

## 2024-05-21 ENCOUNTER — Encounter (HOSPITAL_COMMUNITY): Payer: Self-pay | Admitting: Cardiology

## 2024-05-21 ENCOUNTER — Inpatient Hospital Stay (HOSPITAL_COMMUNITY)

## 2024-05-21 DIAGNOSIS — R29700 NIHSS score 0: Secondary | ICD-10-CM | POA: Diagnosis not present

## 2024-05-21 DIAGNOSIS — I779 Disorder of arteries and arterioles, unspecified: Secondary | ICD-10-CM | POA: Diagnosis not present

## 2024-05-21 DIAGNOSIS — E1151 Type 2 diabetes mellitus with diabetic peripheral angiopathy without gangrene: Secondary | ICD-10-CM | POA: Diagnosis not present

## 2024-05-21 DIAGNOSIS — I6381 Other cerebral infarction due to occlusion or stenosis of small artery: Secondary | ICD-10-CM

## 2024-05-21 DIAGNOSIS — E119 Type 2 diabetes mellitus without complications: Secondary | ICD-10-CM

## 2024-05-21 DIAGNOSIS — E785 Hyperlipidemia, unspecified: Secondary | ICD-10-CM

## 2024-05-21 DIAGNOSIS — I639 Cerebral infarction, unspecified: Secondary | ICD-10-CM | POA: Insufficient documentation

## 2024-05-21 DIAGNOSIS — Z955 Presence of coronary angioplasty implant and graft: Secondary | ICD-10-CM

## 2024-05-21 DIAGNOSIS — I10 Essential (primary) hypertension: Secondary | ICD-10-CM | POA: Insufficient documentation

## 2024-05-21 DIAGNOSIS — R200 Anesthesia of skin: Secondary | ICD-10-CM | POA: Diagnosis not present

## 2024-05-21 DIAGNOSIS — I214 Non-ST elevation (NSTEMI) myocardial infarction: Secondary | ICD-10-CM | POA: Diagnosis not present

## 2024-05-21 LAB — CBC
HCT: 37.9 % (ref 36.0–46.0)
Hemoglobin: 13.3 g/dL (ref 12.0–15.0)
MCH: 32.4 pg (ref 26.0–34.0)
MCHC: 35.1 g/dL (ref 30.0–36.0)
MCV: 92.2 fL (ref 80.0–100.0)
Platelets: 207 K/uL (ref 150–400)
RBC: 4.11 MIL/uL (ref 3.87–5.11)
RDW: 11.1 % — ABNORMAL LOW (ref 11.5–15.5)
WBC: 7.8 K/uL (ref 4.0–10.5)
nRBC: 0 % (ref 0.0–0.2)

## 2024-05-21 LAB — GLUCOSE, CAPILLARY
Glucose-Capillary: 109 mg/dL — ABNORMAL HIGH (ref 70–99)
Glucose-Capillary: 116 mg/dL — ABNORMAL HIGH (ref 70–99)
Glucose-Capillary: 113 mg/dL — ABNORMAL HIGH (ref 70–99)
Glucose-Capillary: 140 mg/dL — ABNORMAL HIGH (ref 70–99)

## 2024-05-21 LAB — LIPOPROTEIN A (LPA): Lipoprotein (a): 34 nmol/L — ABNORMAL HIGH (ref <75.0)

## 2024-05-21 MED ORDER — ROSUVASTATIN CALCIUM 20 MG PO TABS: 40.0000 mg | ORAL_TABLET | Freq: Every day | ORAL | Status: DC

## 2024-05-21 MED ORDER — EZETIMIBE 10 MG PO TABS
10.0000 mg | ORAL_TABLET | Freq: Every day | ORAL | Status: DC
  Administered 2024-05-21: 10 mg via ORAL
  Filled 2024-05-21: qty 1

## 2024-05-21 MED ORDER — STROKE: EARLY STAGES OF RECOVERY BOOK
Status: AC
  Filled 2024-05-21: qty 1

## 2024-05-21 MED ORDER — IOHEXOL 350 MG/ML SOLN
75.0000 mL | Freq: Once | INTRAVENOUS | Status: AC | PRN
  Administered 2024-05-21: 75 mL via INTRAVENOUS

## 2024-05-21 MED ORDER — DAPAGLIFLOZIN PROPANEDIOL 10 MG PO TABS
10.0000 mg | ORAL_TABLET | Freq: Every day | ORAL | Status: DC
  Administered 2024-05-21 – 2024-05-22 (×2): 10 mg via ORAL
  Filled 2024-05-21 (×2): qty 1

## 2024-05-21 MED ORDER — GABAPENTIN 300 MG PO CAPS
300.0000 mg | ORAL_CAPSULE | Freq: Every day | ORAL | Status: DC
  Administered 2024-05-21: 300 mg via ORAL
  Filled 2024-05-21: qty 1

## 2024-05-21 NOTE — Progress Notes (Signed)
 Progress Note   Patient: Ashlee [name] FMW:[id number] DOB: 1971/06/27 DOA: 05/18/2024     3 DOS: the patient was seen and examined on 05/21/2024   Brief hospital course: Ashlee [name] is a 53 y.o. female with hx of CAD by imaging, HTN, HLD, Hx provoked PE, completed 3 months of AC, who presents with chest pain. Reports onset of R parasternal chest pain about 4 weeks ago, described as sharp, lasting for about 5 minutes. Mainly she noted after eating foods/ drinking liquid, noting worse with cold. She thought may be related to esophagitis. However, she has had escalating pattern and severity of pain, especially over the past 4 days. She has begun having exertional component to pain which is relieved with rest. Pain is severe 8/10. Relieved with NTG tab in the ED. She was not taking aspirin  prior to this admission, [name] hx of MI in the past.   Assessment and Plan: NSTEMI S/p cardiac cath- mid LAD DES placed 05/19/24. Continue aspirin , brillinta, beta blocker, statin therapy. Cardiology team follow up appreciated. Transfer to tele medical floor.  Acute right thalamic stroke Left-sided sensory deficits improved MRI brain reviewed. CTA head and neck [name] LVO. LDL 72, A1c 7.3. Continue aspirin , brillinta, statin therapy. Neurology advised risk factor modification, neurosurgery as outpatient for cervical DJD. PT/ OT evaluation for dc planning. Stroke education.  New onset diabetes- A1c 7.3. Accu-Cheks, sliding scale ordered. Diabetes educator consulted. Diet changed to heart healthy carb consistent diet.   Hypertension- Continue treatment with metoprolol , has sinus tachycardia. Hold benazepril  and spironolactone  for now.   Hyperlipidemia Continue Crestor .   GERD Continue PPI   H/o of provoked PE CT angiogram done this admission in [facility] facility demonstrated negative for pulmonary embolism. She finished 3 months of anticoagulation.        Out of bed to chair. Incentive spirometry. Nursing  supportive care. Fall, aspiration precautions. Diet:  Diet Orders (From admission, onward)     Start     Ordered   05/20/24 2201  Diet heart healthy/carb modified Room service appropriate? Yes; Fluid consistency: Thin  Diet effective now       Question Answer Comment  Diet-HS Snack? Nothing   Room service appropriate? Yes   Fluid consistency: Thin      05/20/24 2201           DVT prophylaxis: SCD's Start: 05/19/24 1646  Level of care: Telemetry Cardiac   Code Status: Full Code  Subjective: Patient is seen and examined today morning. Tearful about her medica condition. Mother at bedside. She is able to get out of bed. Discussed with spouse over phone, mother at bedside.  Physical Exam: Vitals:   05/21/24 1100 05/21/24 1200 05/21/24 1300 05/21/24 1400  BP:    102/76  Pulse:  85 (!) 103 (!) 110  Resp:  (!) 24 14 (!) 24  Temp: 97.6 F (36.4 C)     TempSrc: Axillary     SpO2:  97% 93% 97%  Weight:      Height:        General - Middle aged Caucasian female, [name] apparent distress HEENT - PERRLA, EOMI, atraumatic head, non tender sinuses. Lung - Clear, [name] rales, rhonchi, wheezes. Heart - S1, S2 heard, [name] murmurs, has tachycardia, trace pedal edema. Abdomen - Soft, non tender, bowel sounds good Neuro - Alert, awake and oriented x 3, non focal exam. Skin - Warm and dry.  Data Reviewed:      Latest Ref Rng & Units 05/21/2024  7:06 AM 05/20/2024    3:28 AM 05/19/2024    5:41 AM  CBC  WBC 4.0 - 10.5 K/uL 7.8  10.4  8.7   Hemoglobin 12.0 - 15.0 g/dL 86.6  86.9  87.2   Hematocrit 36.0 - 46.0 % 37.9  37.0  36.1   Platelets 150 - 400 K/uL 207  217  201       Latest Ref Rng & Units 05/20/2024    3:28 AM 05/19/2024    5:41 AM 05/18/2024    6:05 PM  BMP  Glucose 70 - 99 mg/dL 872  861  851   BUN 6 - 20 mg/dL 8  10  12    Creatinine 0.44 - 1.00 mg/dL 9.16  9.28  9.22   Sodium 135 - 145 mmol/L 138  141  140   Potassium 3.5 - 5.1 mmol/L 3.7  3.7  3.8   Chloride 98 - 111  mmol/L 106  112  104   CO2 22 - 32 mmol/L 18  21  22    Calcium  8.9 - 10.3 mg/dL 9.0  8.5  9.1    CT ANGIO HEAD NECK W WO CM Result Date: 05/21/2024 CLINICAL DATA:  Initial evaluation for acute neuro deficit, stroke suspected. EXAM: CT ANGIOGRAPHY HEAD AND NECK WITH AND WITHOUT CONTRAST TECHNIQUE: Multidetector CT imaging of the head and neck was performed using the standard protocol during bolus administration of intravenous contrast. Multiplanar CT image reconstructions and MIPs were obtained to evaluate the vascular anatomy. Carotid stenosis measurements (when applicable) are obtained utilizing NASCET criteria, using the distal internal carotid diameter as the denominator. RADIATION DOSE REDUCTION: This exam was performed according to the departmental dose-optimization program which includes automated exposure control, adjustment of the mA and/or kV according to patient size and/or use of iterative reconstruction technique. CONTRAST:  75mL OMNIPAQUE  IOHEXOL  350 MG/ML SOLN COMPARISON:  Prior CT and MRI from 05/20/2024. FINDINGS: CTA NECK FINDINGS Aortic arch: Visualized aortic arch within normal limits for caliber standard branch pattern. Mild aortic atherosclerosis. [name] significant stenosis about the origin the great vessels. Right carotid system: Right common and internal carotid arteries are patent without dissection. Mild atheromatous change about the right carotid bulb without hemodynamically significant greater than 50% stenosis. Left carotid system: Left common and internal carotid arteries are patent without dissection. Mild atheromatous change about the left carotid bulb without hemodynamically significant greater than 50% stenosis. Vertebral arteries: Both vertebral arteries arise from subclavian arteries. [name] proximal subclavian artery stenosis. Vertebral arteries are patent without stenosis or dissection. Skeleton: [name] worrisome osseous lesions. Other neck: Small area of stranding noted involving the  subcutaneous fat of the left lateral lower face (series 7, image 212). Finding is nonspecific, but could reflect sequelae of trauma, contusion or possibly localized infection. [name] other acute finding. Upper chest: 4 mm left upper lobe pulmonary nodule (series 5, image 10). Review of the MIP images confirms the above findings CTA HEAD FINDINGS Anterior circulation: Both internal carotid arteries are patent through the siphons to the termini without stenosis or other abnormality. A1 segments patent bilaterally. Normal anterior communicating artery complex. Anterior cerebral arteries patent without stenosis. [name] M1 stenosis or occlusion. [name] proximal MCA branch occlusion or high-grade stenosis. Distal MCA branches perfused and symmetric. Posterior circulation: Both V4 segments patent without stenosis. Both PICA patent. Basilar patent without stenosis. Superior cerebral arteries patent bilaterally. Both PCAs primarily supplied via the basilar. Mild atheromatous irregularity about the right PCA without hemodynamically significant stenosis. Focal severe distal left P2  stenosis (series 10, image 23). Left PCA remains patent to its distal aspect. Venous sinuses: Patent allowing for timing the contrast bolus. Anatomic variants: None significant.  [name] aneurysm. Review of the MIP images confirms the above findings IMPRESSION: 1. Negative CTA for large vessel occlusion or other emergent finding. 2. Focal severe distal left P2 stenosis. 3. Otherwise mild for age atheromatous change elsewhere about the major arterial vasculature of the head and neck. [name] other hemodynamically significant or correctable stenosis. 4. Small area of fat stranding involving the subcutaneous fat of the left lower face as above. Finding is nonspecific, with primary considerations including sequelae of trauma/contusion or possibly localized infection. Correlation with physical exam recommended. 5. 4 mm left upper lobe pulmonary nodule, indeterminate. Per  Fleischner Society Guidelines,if patient is low risk for malignancy, [name] routine follow-up imaging is recommended. If patient is high risk for malignancy, a non-contrast Chest CT at 12 months is optional. If performed and the nodule is stable at 12 months, [name] further follow-up is recommended. These guidelines do not apply to immunocompromised patients and patients with cancer. Follow up in patients with significant comorbidities as clinically warranted. For lung cancer screening, adhere to Lung-RADS guidelines. Reference: Radiology. 2017; 284(1):228-43. Aortic Atherosclerosis (ICD10-I70.0) Electronically Signed   By: Morene Hoard M.D.   On: 05/21/2024 04:15   MR CERVICAL SPINE WO CONTRAST Result Date: 05/20/2024 EXAM: MRI CERVICAL SPINE WITHOUT CONTRAST 05/20/2024 05:19:01 PM TECHNIQUE: Multiplanar multisequence MRI of the cervical spine was performed. COMPARISON: None available. CLINICAL HISTORY: Cervical radiculopathy, [name] red flags. FINDINGS: BONES AND ALIGNMENT: Straightening of the normal cervical lordosis is present. Normal vertebral body heights. Bone marrow signal is unremarkable. SPINAL CORD: Normal spinal cord size. [name] abnormal spinal cord signal. SOFT TISSUES: [name] paraspinal mass. C2-C3: [name] significant disc herniation. [name] spinal canal stenosis or neural foraminal narrowing. C3-C4: Uncovertebral and facet hypertrophy present bilaterally with moderate foraminal narrowing bilaterally. C4-C5: A broad-based disc osteophyte complex distorts the ventral surface of the cord and narrows the canal to 6 mm. Moderate foraminal stenosis is present bilaterally. C5-C6: A broad based disc osteophyte partially effaces the ventral CSF. Severe right and mild left foraminal stenosis is present. C6-C7: A shallow soft disc protrusion is present without significant stenosis. C7-T1: Mild facet hypertrophy is worse on the left. [name] focal disc protrusion or stenosis is present. IMPRESSION: 1. Broad-based disc osteophyte  complex at C4-5 distorting the ventral surface of the cord and narrowing the canal to 6 mm, with moderate foraminal stenosis bilaterally. [name] cord signal abnormality is present. 2. Broad-based disc osteophyte at C5-6 partially effacing the ventral CSF, with severe right and mild left foraminal stenosis. 3. Shallow soft disc protrusion at C6-7 without significant stenosis. 4. Uncovertebral and facet hypertrophy at C3-4 with moderate foraminal narrowing bilaterally. Electronically signed by: Lonni Necessary MD 05/20/2024 05:52 PM EDT RP Workstation: [id number]   MR BRAIN WO CONTRAST Result Date: 05/20/2024 EXAM: MRI BRAIN WITHOUT CONTRAST 05/20/2024 05:19:12 PM TECHNIQUE: Multiplanar multisequence MRI of the head/brain was performed without the administration of intravenous contrast. COMPARISON: None available. CLINICAL HISTORY: Mental status change, unknown cause. FINDINGS: BRAIN AND VENTRICLES: An acute/subacute nonhemorrhagic 12 mm infarct is present in the lateral right thalamus. Scattered subcortical T2 hyperintensities bilaterally are moderately advanced for age. This most likely reflects the sequelae of chronic microvascular ischemia. [name] intracranial hemorrhage. [name] mass. [name] midline shift. [name] hydrocephalus. The sella is unremarkable. Normal flow voids. ORBITS: [name] acute abnormality. SINUSES AND MASTOIDS: A polyp or mixed retention cyst is present  in the left maxillary sinus. BONES AND SOFT TISSUES: Normal marrow signal. [name] acute soft tissue abnormality. IMPRESSION: 1. Acute/subacute nonhemorrhagic 12 mm infarct in the lateral right thalamus. 2. Moderately advanced scattered subcortical T2 hyperintensities bilaterally, likely sequelae of chronic microvascular ischemia. Electronically signed by: Lonni Necessary MD 05/20/2024 05:48 PM EDT RP Workstation: [id number]   ECHOCARDIOGRAM COMPLETE Result Date: 05/20/2024    ECHOCARDIOGRAM REPORT   Patient Name:   Ashlee [name] Date of Exam: 05/20/2024 Medical  Rec #:  [id number]    Height:       62.0 in Accession #:    [id number]   Weight:       162.9 lb Date of Birth:  1971/03/08     BSA:          1.752 m Patient Age:    53 years     BP:           100/79 mmHg Patient Gender: F            HR:           90 bpm. Exam Location:  Inpatient Procedure: 2D Echo, Color Doppler, Cardiac Doppler, 3D Echo and Intracardiac            Opacification Agent (Both Spectral and Color Flow Doppler were            utilized during procedure). Indications:    NSTEMI  History:        Patient has prior history of Echocardiogram examinations, most                 recent 06/18/2021. Risk Factors:Hypertension and Dyslipidemia.  Sonographer:    Logan Shove RDCS Referring Phys: [id number] JONATHAN SEGARS IMPRESSIONS  1. Left ventricular ejection fraction, by estimation, is 35 to 40%. Left ventricular ejection fraction by 3D volume is 40 %. The left ventricle has moderately decreased function. The left ventricle demonstrates regional wall motion abnormalities (see scoring diagram/findings for description). Left ventricular diastolic parameters are consistent with Grade I diastolic dysfunction (impaired relaxation).  2. Right ventricular systolic function is normal. The right ventricular size is normal.  3. The mitral valve is normal in structure. Mild mitral valve regurgitation. [name] evidence of mitral stenosis.  4. The aortic valve is tricuspid. Aortic valve regurgitation is not visualized. [name] aortic stenosis is present.  5. The inferior vena cava is normal in size with greater than 50% respiratory variability, suggesting right atrial pressure of 3 mmHg. FINDINGS  Left Ventricle: Left ventricular ejection fraction, by estimation, is 35 to 40%. Left ventricular ejection fraction by 3D volume is 40 %. The left ventricle has moderately decreased function. The left ventricle demonstrates regional wall motion abnormalities. Definity contrast agent was given IV to delineate the left ventricular endocardial borders.  The left ventricular internal cavity size was normal in size. There is [name] left ventricular hypertrophy. Left ventricular diastolic parameters are consistent with Grade I diastolic dysfunction (impaired relaxation).  LV Wall Scoring: The anterior septum is akinetic. Right Ventricle: The right ventricular size is normal. [name] increase in right ventricular wall thickness. Right ventricular systolic function is normal. Left Atrium: Left atrial size was normal in size. Right Atrium: Right atrial size was normal in size. Pericardium: There is [name] evidence of pericardial effusion. Mitral Valve: The mitral valve is normal in structure. Mild mitral valve regurgitation. [name] evidence of mitral valve stenosis. Tricuspid Valve: The tricuspid valve is normal in structure. Tricuspid valve regurgitation is not demonstrated. [name] evidence of tricuspid stenosis. Aortic  Valve: The aortic valve is tricuspid. Aortic valve regurgitation is not visualized. [name] aortic stenosis is present. Aortic valve peak gradient measures 3.8 mmHg. Pulmonic Valve: The pulmonic valve was normal in structure. Pulmonic valve regurgitation is not visualized. [name] evidence of pulmonic stenosis. Aorta: The aortic root is normal in size and structure. Venous: The inferior vena cava is normal in size with greater than 50% respiratory variability, suggesting right atrial pressure of 3 mmHg. IAS/Shunts: [name] atrial level shunt detected by color flow Doppler.  LEFT VENTRICLE PLAX 2D LVIDd:         4.50 cm         Diastology LVIDs:         3.50 cm         LV e' medial:    4.57 cm/s LV PW:         1.00 cm         LV E/e' medial:  10.4 LV IVS:        0.80 cm         LV e' lateral:   6.85 cm/s LVOT diam:     2.00 cm         LV E/e' lateral: 7.0 LVOT Area:     3.14 cm                                 3D Volume EF LV Volumes (MOD)               LV 3D EF:    Left LV vol d, MOD    91.2 ml                    ventricul A2C:                                        ar LV vol d, MOD     98.8 ml                    ejection A4C:                                        fraction LV vol s, MOD    54.3 ml                    by 3D A2C:                                        volume is LV vol s, MOD    58.9 ml                    40 %. A4C: LV SV MOD A2C:   36.9 ml LV SV MOD A4C:   98.8 ml       3D Volume EF: LV SV MOD BP:    38.3 ml       3D EF:        40 %  LV EDV:       125 ml                                LV ESV:       74 ml                                LV SV:        50 ml RIGHT VENTRICLE RV Basal diam:  2.50 cm RV S prime:     10.40 cm/s TAPSE (M-mode): 1.2 cm LEFT ATRIUM           Index        RIGHT ATRIUM          Index LA diam:      3.30 cm 1.88 cm/m   RA Area:     9.43 cm LA Vol (A2C): 45.8 ml 26.14 ml/m  RA Volume:   18.00 ml 10.27 ml/m LA Vol (A4C): 18.0 ml 10.27 ml/m  AORTIC VALVE AV Area (Vmax): 2.63 cm AV Vmax:        97.00 cm/s AV Peak Grad:   3.8 mmHg LVOT Vmax:      81.20 cm/s  AORTA Ao Root diam: 2.80 cm Ao Asc diam:  2.60 cm MITRAL VALVE MV Area (PHT): 4.39 cm    SHUNTS MV Decel Time: 173 msec    Systemic Diam: 2.00 cm MV E velocity: 47.70 cm/s MV A velocity: 99.60 cm/s MV E/A ratio:  0.48 Oneil Parchment MD Electronically signed by Oneil Parchment MD Signature Date/Time: 05/20/2024/2:52:25 PM    Final    CT HEAD WO CONTRAST ( ) Result Date: 05/20/2024 EXAM: CT HEAD WITHOUT CONTRAST 05/20/2024 11:37:56 AM TECHNIQUE: CT of the head was performed without the administration of intravenous contrast. Automated exposure control, iterative reconstruction, and/or weight based adjustment of the mA/kV was utilized to reduce the radiation dose to as low as reasonably achievable. COMPARISON: None available. CLINICAL HISTORY: Neuro deficit, acute, stroke suspected. NSTEMI yesterday. PTCA to the LAD yesterday. FINDINGS: BRAIN AND VENTRICLES: [name] acute hemorrhage. [name] evidence of acute infarct. [name] hydrocephalus. [name] extra-axial collection. [name] mass effect or midline shift.  ORBITS: [name] acute abnormality. SINUSES: [name] acute abnormality. SOFT TISSUES AND SKULL: [name] acute soft tissue abnormality. [name] skull fracture. IMPRESSION: 1. [name] acute intracranial abnormality. Electronically signed by: Lonni Necessary MD 05/20/2024 11:51 AM EDT RP Workstation: [id number]   CARDIAC CATHETERIZATION Addendum Date: 05/19/2024 Coronary intervention 05/19/2024: LM: Normal LAD: Mid focal 99% stenosis          Diffuse 50 to 70% disease in mid LAD, distal apical focal 80% stenosis          Small caliber bifurcating diagonal 1 with ostial 80% stenosis Lcx: Large dominant vessel with proximal 20%, OM1 30% disease RCA: Small nondominant vessel, [name] significant disease LVEDP 19 mmHg Successful percutaneous coronary intervention mid LAD        PTCA and stent placement 3.5 X 20 mm & 3.0 X 20 mm Synergy drug-eluting stents        Post dilatation using 3.0 X 20 and 3.5 X 12 mm [place] balloons up to 20 atm        IVUS with excellent stent expansion and apposition, [name] stent edge dissection        Recommend medical management for rest of the disease Manish JINNY Lawrence, MD   Result  Date: 05/19/2024 Images from the original result were not included. Coronary intervention 05/19/2024: LM: Normal LAD: Mid focal 99% stenosis          Diffuse 50 to 70% disease in mid LAD, distal apical focal 80% stenosis          Small caliber bifurcating diagonal 1 with ostial 80% stenosis Lcx: Large dominant vessel with proximal 20%, OM1 30% disease RCA: Small nondominant vessel, [name] significant disease LVEDP 19 mmHg Successful percutaneous coronary intervention mid LAD        PTCA and stent placement 3.5 X 20 mm & 3.0 X 20 mm Synergy drug-eluting stents        Post dilatation using 3.0 X 20 and 3.5 X 12 mm [place] balloons up to 20 atm        IVUS with excellent stent expansion and apposition, [name] stent edge dissection        Recommend medical management for rest of the disease Manish JINNY Lawrence, MD    Family Communication: Discussed with  patient, spouse at bedside. They understand and agree. All questions answered.  Disposition: Status is: Inpatient Remains inpatient appropriate because: post cath, acute stroke  Planned Discharge Destination: Home with Home Health     Time spent: 51 minutes  Author: Concepcion Riser, MD 05/21/2024 2:37 PM Secure chat 7am to 7pm For on call review [email or web address].

## 2024-05-21 NOTE — Plan of Care (Signed)
 Problem: Education: Goal: Knowledge of General Education information will improve Description: Including pain rating scale, medication(s)/side effects and non-pharmacologic comfort measures Outcome: Progressing   Problem: Health Behavior/Discharge Planning: Goal: Ability to manage health-related needs will improve Outcome: Progressing   Problem: Clinical Measurements: Goal: Ability to maintain clinical measurements within normal limits will improve Outcome: Progressing Goal: Will remain free from infection Outcome: Progressing Goal: Diagnostic test results will improve Outcome: Progressing Goal: Respiratory complications will improve Outcome: Progressing Goal: Cardiovascular complication will be avoided Outcome: Progressing   Problem: Activity: Goal: Risk for activity intolerance will decrease Outcome: Progressing   Problem: Nutrition: Goal: Adequate nutrition will be maintained Outcome: Progressing   Problem: Coping: Goal: Level of anxiety will decrease Outcome: Progressing   Problem: Elimination: Goal: Will not experience complications related to bowel motility Outcome: Progressing Goal: Will not experience complications related to urinary retention Outcome: Progressing   Problem: Pain Managment: Goal: General experience of comfort will improve and/or be controlled Outcome: Progressing   Problem: Safety: Goal: Ability to remain free from injury will improve Outcome: Progressing   Problem: Skin Integrity: Goal: Risk for impaired skin integrity will decrease Outcome: Progressing   Problem: Education: Goal: Understanding of CV disease, CV risk reduction, and recovery process will improve Outcome: Progressing Goal: Individualized Educational Video(s) Outcome: Progressing   Problem: Activity: Goal: Ability to return to baseline activity level will improve Outcome: Progressing   Problem: Cardiovascular: Goal: Ability to achieve and maintain adequate  cardiovascular perfusion will improve Outcome: Progressing Goal: Vascular access site(s) Level 0-1 will be maintained Outcome: Progressing   Problem: Health Behavior/Discharge Planning: Goal: Ability to safely manage health-related needs after discharge will improve Outcome: Progressing   Problem: Education: Goal: Ability to describe self-care measures that may prevent or decrease complications (Diabetes Survival Skills Education) will improve Outcome: Progressing Goal: Individualized Educational Video(s) Outcome: Progressing   Problem: Coping: Goal: Ability to adjust to condition or change in health will improve Outcome: Progressing   Problem: Fluid Volume: Goal: Ability to maintain a balanced intake and output will improve Outcome: Progressing   Problem: Health Behavior/Discharge Planning: Goal: Ability to identify and utilize available resources and services will improve Outcome: Progressing Goal: Ability to manage health-related needs will improve Outcome: Progressing   Problem: Metabolic: Goal: Ability to maintain appropriate glucose levels will improve Outcome: Progressing   Problem: Nutritional: Goal: Maintenance of adequate nutrition will improve Outcome: Progressing Goal: Progress toward achieving an optimal weight will improve Outcome: Progressing   Problem: Skin Integrity: Goal: Risk for impaired skin integrity will decrease Outcome: Progressing   Problem: Tissue Perfusion: Goal: Adequacy of tissue perfusion will improve Outcome: Progressing   Problem: Education: Goal: Knowledge of disease or condition will improve Outcome: Progressing Goal: Knowledge of secondary prevention will improve (MUST DOCUMENT ALL) Outcome: Progressing Goal: Knowledge of patient specific risk factors will improve (DELETE if not current risk factor) Outcome: Progressing   Problem: Ischemic Stroke/TIA Tissue Perfusion: Goal: Complications of ischemic stroke/TIA will be  minimized Outcome: Progressing   Problem: Coping: Goal: Will verbalize positive feelings about self Outcome: Progressing Goal: Will identify appropriate support needs Outcome: Progressing   Problem: Health Behavior/Discharge Planning: Goal: Ability to manage health-related needs will improve Outcome: Progressing Goal: Goals will be collaboratively established with patient/family Outcome: Progressing   Problem: Self-Care: Goal: Ability to participate in self-care as condition permits will improve Outcome: Progressing Goal: Verbalization of feelings and concerns over difficulty with self-care will improve Outcome: Progressing Goal: Ability to communicate needs accurately will improve Outcome: Progressing  Problem: Nutrition: Goal: Risk of aspiration will decrease Outcome: Progressing Goal: Dietary intake will improve Outcome: Progressing

## 2024-05-21 NOTE — Progress Notes (Addendum)
 STROKE TEAM PROGRESS NOTE    SIGNIFICANT HOSPITAL EVENTS 9/25: Patient presented with chest pain, found to be having non-STEMI 9/27: Patient underwent cardiac cath and was found to have left-sided numbness and weakness postprocedure  INTERIM HISTORY/SUBJECTIVE  Patient remains largely hemodynamically stable and afebrile with 1 transient episode of hypotension this morning.  She reports improvement in left-sided numbness and weakness.  OBJECTIVE  CBC    Component Value Date/Time   WBC 7.8 05/21/2024 0706   RBC 4.11 05/21/2024 0706   HGB 13.3 05/21/2024 0706   HCT 37.9 05/21/2024 0706   PLT 207 05/21/2024 0706   MCV 92.2 05/21/2024 0706   MCH 32.4 05/21/2024 0706   MCHC 35.1 05/21/2024 0706   RDW 11.1 (L) 05/21/2024 0706   LYMPHSABS 3.0 05/18/2024 1805   MONOABS 0.5 05/18/2024 1805   EOSABS 0.0 05/18/2024 1805   BASOSABS 0.0 05/18/2024 1805    BMET    Component Value Date/Time   NA 138 05/20/2024 0328   K 3.7 05/20/2024 0328   CL 106 05/20/2024 0328   CO2 18 (L) 05/20/2024 0328   GLUCOSE 127 (H) 05/20/2024 0328   BUN 8 05/20/2024 0328   CREATININE 0.83 05/20/2024 0328   CREATININE 0.83 07/13/2022 1612   CALCIUM  9.0 05/20/2024 0328   GFRNONAA >60 05/20/2024 0328    IMAGING past 24 hours CT ANGIO HEAD NECK W WO CM Result Date: 05/21/2024 CLINICAL DATA:  Initial evaluation for acute neuro deficit, stroke suspected. EXAM: CT ANGIOGRAPHY HEAD AND NECK WITH AND WITHOUT CONTRAST TECHNIQUE: Multidetector CT imaging of the head and neck was performed using the standard protocol during bolus administration of intravenous contrast. Multiplanar CT image reconstructions and MIPs were obtained to evaluate the vascular anatomy. Carotid stenosis measurements (when applicable) are obtained utilizing NASCET criteria, using the distal internal carotid diameter as the denominator. RADIATION DOSE REDUCTION: This exam was performed according to the departmental dose-optimization program which  includes automated exposure control, adjustment of the mA and/or kV according to patient size and/or use of iterative reconstruction technique. CONTRAST:  75mL OMNIPAQUE  IOHEXOL  350 MG/ML SOLN COMPARISON:  Prior CT and MRI from 05/20/2024. FINDINGS: CTA NECK FINDINGS Aortic arch: Visualized aortic arch within normal limits for caliber standard branch pattern. Mild aortic atherosclerosis. No significant stenosis about the origin the great vessels. Right carotid system: Right common and internal carotid arteries are patent without dissection. Mild atheromatous change about the right carotid bulb without hemodynamically significant greater than 50% stenosis. Left carotid system: Left common and internal carotid arteries are patent without dissection. Mild atheromatous change about the left carotid bulb without hemodynamically significant greater than 50% stenosis. Vertebral arteries: Both vertebral arteries arise from subclavian arteries. No proximal subclavian artery stenosis. Vertebral arteries are patent without stenosis or dissection. Skeleton: No worrisome osseous lesions. Other neck: Small area of stranding noted involving the subcutaneous fat of the left lateral lower face (series 7, image 212). Finding is nonspecific, but could reflect sequelae of trauma, contusion or possibly localized infection. No other acute finding. Upper chest: 4 mm left upper lobe pulmonary nodule (series 5, image 10). Review of the MIP images confirms the above findings CTA HEAD FINDINGS Anterior circulation: Both internal carotid arteries are patent through the siphons to the termini without stenosis or other abnormality. A1 segments patent bilaterally. Normal anterior communicating artery complex. Anterior cerebral arteries patent without stenosis. No M1 stenosis or occlusion. No proximal MCA branch occlusion or high-grade stenosis. Distal MCA branches perfused and symmetric. Posterior circulation: Both V4  segments patent without  stenosis. Both PICA patent. Basilar patent without stenosis. Superior cerebral arteries patent bilaterally. Both PCAs primarily supplied via the basilar. Mild atheromatous irregularity about the right PCA without hemodynamically significant stenosis. Focal severe distal left P2 stenosis (series 10, image 23). Left PCA remains patent to its distal aspect. Venous sinuses: Patent allowing for timing the contrast bolus. Anatomic variants: None significant.  No aneurysm. Review of the MIP images confirms the above findings IMPRESSION: 1. Negative CTA for large vessel occlusion or other emergent finding. 2. Focal severe distal left P2 stenosis. 3. Otherwise mild for age atheromatous change elsewhere about the major arterial vasculature of the head and neck. No other hemodynamically significant or correctable stenosis. 4. Small area of fat stranding involving the subcutaneous fat of the left lower face as above. Finding is nonspecific, with primary considerations including sequelae of trauma/contusion or possibly localized infection. Correlation with physical exam recommended. 5. 4 mm left upper lobe pulmonary nodule, indeterminate. Per Fleischner Society Guidelines,if patient is low risk for malignancy, no routine follow-up imaging is recommended. If patient is high risk for malignancy, a non-contrast Chest CT at 12 months is optional. If performed and the nodule is stable at 12 months, no further follow-up is recommended. These guidelines do not apply to immunocompromised patients and patients with cancer. Follow up in patients with significant comorbidities as clinically warranted. For lung cancer screening, adhere to Lung-RADS guidelines. Reference: Radiology. 2017; 284(1):228-43. Aortic Atherosclerosis (ICD10-I70.0) Electronically Signed   By: Morene Hoard M.D.   On: 05/21/2024 04:15   MR CERVICAL SPINE WO CONTRAST Result Date: 05/20/2024 EXAM: MRI CERVICAL SPINE WITHOUT CONTRAST 05/20/2024 05:19:01 PM  TECHNIQUE: Multiplanar multisequence MRI of the cervical spine was performed. COMPARISON: None available. CLINICAL HISTORY: Cervical radiculopathy, no red flags. FINDINGS: BONES AND ALIGNMENT: Straightening of the normal cervical lordosis is present. Normal vertebral body heights. Bone marrow signal is unremarkable. SPINAL CORD: Normal spinal cord size. No abnormal spinal cord signal. SOFT TISSUES: No paraspinal mass. C2-C3: No significant disc herniation. No spinal canal stenosis or neural foraminal narrowing. C3-C4: Uncovertebral and facet hypertrophy present bilaterally with moderate foraminal narrowing bilaterally. C4-C5: A broad-based disc osteophyte complex distorts the ventral surface of the cord and narrows the canal to 6 mm. Moderate foraminal stenosis is present bilaterally. C5-C6: A broad based disc osteophyte partially effaces the ventral CSF. Severe right and mild left foraminal stenosis is present. C6-C7: A shallow soft disc protrusion is present without significant stenosis. C7-T1: Mild facet hypertrophy is worse on the left. No focal disc protrusion or stenosis is present. IMPRESSION: 1. Broad-based disc osteophyte complex at C4-5 distorting the ventral surface of the cord and narrowing the canal to 6 mm, with moderate foraminal stenosis bilaterally. No cord signal abnormality is present. 2. Broad-based disc osteophyte at C5-6 partially effacing the ventral CSF, with severe right and mild left foraminal stenosis. 3. Shallow soft disc protrusion at C6-7 without significant stenosis. 4. Uncovertebral and facet hypertrophy at C3-4 with moderate foraminal narrowing bilaterally. Electronically signed by: Lonni Necessary MD 05/20/2024 05:52 PM EDT RP Workstation: [id number]   MR BRAIN WO CONTRAST Result Date: 05/20/2024 EXAM: MRI BRAIN WITHOUT CONTRAST 05/20/2024 05:19:12 PM TECHNIQUE: Multiplanar multisequence MRI of the head/brain was performed without the administration of intravenous contrast.  COMPARISON: None available. CLINICAL HISTORY: Mental status change, unknown cause. FINDINGS: BRAIN AND VENTRICLES: An acute/subacute nonhemorrhagic 12 mm infarct is present in the lateral right thalamus. Scattered subcortical T2 hyperintensities bilaterally are moderately advanced for age. This most  likely reflects the sequelae of chronic microvascular ischemia. No intracranial hemorrhage. No mass. No midline shift. No hydrocephalus. The sella is unremarkable. Normal flow voids. ORBITS: No acute abnormality. SINUSES AND MASTOIDS: A polyp or mixed retention cyst is present in the left maxillary sinus. BONES AND SOFT TISSUES: Normal marrow signal. No acute soft tissue abnormality. IMPRESSION: 1. Acute/subacute nonhemorrhagic 12 mm infarct in the lateral right thalamus. 2. Moderately advanced scattered subcortical T2 hyperintensities bilaterally, likely sequelae of chronic microvascular ischemia. Electronically signed by: Lonni Necessary MD 05/20/2024 05:48 PM EDT RP Workstation: [id number]    Vitals:   05/21/24 1100 05/21/24 1200 05/21/24 1300 05/21/24 1400  BP:    102/76  Pulse:  85 (!) 103 (!) 110  Resp:  (!) 24 14 (!) 24  Temp: 97.6 F (36.4 C)     TempSrc: Axillary     SpO2:  97% 93% 97%  Weight:      Height:         PHYSICAL EXAM General:  Alert, well-nourished, well-developed patient in no acute distress Psych:  Mood and affect appropriate for situation CV: Regular rate and rhythm on monitor Respiratory:  Regular, unlabored respirations on room air   NEURO:  Mental Status: AA&Ox3, patient is able to give clear and coherent history Speech/Language: speech is without dysarthria or aphasia.    Cranial Nerves:  II: PERRL. Visual fields full.  III, IV, VI: EOMI. Eyelids elevate symmetrically.  V: Sensation is intact to light touch and symmetrical to face.  VII: Face is symmetrical resting and smiling VIII: hearing intact to voice. IX, X: Phonation is normal.  KP:Dynloizm shrug  5/5. XII: tongue is midline without fasciculations. Motor: 5/5 strength to all muscle groups tested.  Diminished fine finger movements on the left and right arm orbits left Tone: is normal and bulk is normal Sensation- Intact to light touch bilaterally.  Coordination: FTN intact bilaterally Gait- deferred  Most Recent NIH 0   ASSESSMENT/PLAN  Ms. Ashlee [name] is a 53 y.o. female with history of CAD, hypertension, hyperlipidemia, provoked PE who was originally admitted for chest pain and was found to have a non-STEMI.  She did undergo PCI with stenting and developed tingling in her fingers on the left hand as well as more generalized left-sided numbness and weakness postprocedure.  She was found to have an acute right thalamic infarct on MRI.  Initial NIH 0  Stroke: Right thalamic infarct, etiology: likely small or large vessel disease given risk factors CT head No acute abnormality.  CTA head & neck no LVO, distal left P2 stenosis MRI acute ischemic infarct in the right thalamus, moderate chronic small vessel ischemic disease 2D Echo EF 35 to 40% LDL 72 HgbA1c 7.3 UDS pending VTE prophylaxis -SCDs No antithrombotic prior to admission, now on aspirin  81 mg daily and Brilinta  (ticagrelor ) 90 mg bid, duration per cardiology team Therapy recommendations:  outpt PT Disposition: Pending   CAD/Non-STEMI cardiomyopathy Patient was admitted with chest pain, found to be having non-STEMI Status post PCI with stenting Further care per cardiology team On DAPT EF 35-40% GDMT management per cardiology  Hypertension Home meds: Benzeapril 20 mg daily, metoprolol  XL 25 mg daily, spironolactone  25 mg daily Stable On metoprolol  25 bid Long term BP goal normotensive  Hyperlipidemia Home meds: Rosuvastatin  20 mg daily LDL 72, goal < 70 Increase crestor  to 40 Continue statin at discharge  Diabetes type II poorly controlled Home meds: None HgbA1c 7.3, goal < 7.0 CBGs SSI Recommend close  follow-up  with PCP for better DM control  Other Stroke Risk Factors   Other Active Problems Cervical disc disease - MRI C-spine Broad-based disc osteophyte complex at C4-5 distorting the ventral surface of the cord and narrowing the canal to 6 mm, with moderate foraminal stenosis bilaterally. No cord signal abnormality is present. Outpt follow up with neurosurgery.   Hospital day # 3  Neurology will sign off. Please call with questions. Pt will follow up with stroke clinic NP at [facility] in about 4 weeks. Thanks for the consult.  Ary Cummins, MD PhD Stroke Neurology 05/21/2024 10:18 PM   To contact Stroke Continuity provider, please refer to [email or web address]. After hours, contact General Neurology

## 2024-05-21 NOTE — Evaluation (Signed)
 Physical Therapy Evaluation Patient Details Name: Ashlee [name] MRN: [id number] DOB: December 14, 1970 Today's Date: 05/21/2024  History of Present Illness  53 y.o. female presents to [facility] hospital on 05/18/2024 with chest pain. Pt admitted with concern for NSTEMI. Pt underwent heart cath with stent placement in LAD on 9/26. Pt reported new L numbness on 9/27, MRI with finding of R thalamic infarct. PMH includes CAD, HTN, HLD, PE.  Clinical Impression  Pt presents to PT with deficits in sensation, strength, power, gait, balance. Pt with L numbness and weakness resulting in imbalance when mobilizing without UE support. Pt will benefit from frequent mobilization in an effort to improve strength and stability. PT recommends outpatient PT at the time of discharge and encourages use of a RW for all out of bed mobility at this time.        If plan is discharge home, recommend the following: A little help with walking and/or transfers;A little help with bathing/dressing/bathroom;Assistance with cooking/housework;Assist for transportation;Help with stairs or ramp for entrance   Can travel by private vehicle        Equipment Recommendations Rolling walker (2 wheels)  Recommendations for Other Services       Functional Status Assessment Patient has had a recent decline in their functional status and demonstrates the ability to make significant improvements in function in a reasonable and predictable amount of time.     Precautions / Restrictions Precautions Precautions: Fall Recall of Precautions/Restrictions: Intact Restrictions Weight Bearing Restrictions Per Provider Order: No      Mobility  Bed Mobility Overal bed mobility: Modified Independent                  Transfers Overall transfer level: Needs assistance Equipment used: Rolling walker (2 wheels) Transfers: Sit to/from Stand Sit to Stand: Supervision                Ambulation/Gait Ambulation/Gait assistance:  Supervision, Min assist Gait Distance (Feet): 250 Feet (250' with RW, final 6' without DME) Assistive device: Rolling walker (2 wheels), None Gait Pattern/deviations: Step-through pattern, Decreased dorsiflexion - left Gait velocity: reduced Gait velocity interpretation: <1.8 ft/sec, indicate of risk for recurrent falls   General Gait Details: pt with slowed step-through gait, reduced L foot clearance noted throughout. When ambulating final 7' without DME pt with multiple instances of staggering to left side and requiring PT assistance to correct balance  Stairs            Wheelchair Mobility     Tilt Bed    Modified Rankin (Stroke Patients Only) Modified Rankin (Stroke Patients Only) Pre-Morbid Rankin Score: No symptoms Modified Rankin: Moderately severe disability     Balance Overall balance assessment: Needs assistance Sitting-balance support: No upper extremity supported, Feet supported Sitting balance-Leahy Scale: Good     Standing balance support: Single extremity supported, Reliant on assistive device for balance Standing balance-Leahy Scale: Poor                               Pertinent Vitals/Pain Pain Assessment Pain Assessment: No/denies pain    Home Living Family/patient expects to be discharged to:: Private residence Living Arrangements: Spouse/significant other;Children Available Help at Discharge: Family;Available PRN/intermittently Type of Home: House Home Access: Level entry     Alternate Level Stairs-Number of Steps: flight Home Layout: Two level;Able to live on main level with bedroom/bathroom Home Equipment: None      Prior Function Prior Level of Function :  Independent/Modified Independent;Driving                     Extremity/Trunk Assessment   Upper Extremity Assessment Upper Extremity Assessment: LUE deficits/detail LUE Deficits / Details: grossly 4/5, ROM WFL LUE Sensation: decreased light touch    Lower  Extremity Assessment Lower Extremity Assessment: LLE deficits/detail LLE Deficits / Details: grossly 4/5 aside from 4-/5 ankle DF LLE Sensation: decreased light touch    Cervical / Trunk Assessment Cervical / Trunk Assessment: Normal  Communication   Communication Communication: No apparent difficulties    Cognition Arousal: Alert Behavior During Therapy: WFL for tasks assessed/performed   PT - Cognitive impairments: Awareness                         Following commands: Intact       Cueing Cueing Techniques: Verbal cues     General Comments General comments (skin integrity, edema, etc.): pt with tachycardia into low 140s with activity, HR recovers quickly to <110 once seated and resting    Exercises     Assessment/Plan    PT Assessment Patient needs continued PT services  PT Problem List Decreased strength;Decreased activity tolerance;Decreased mobility;Decreased balance;Decreased knowledge of use of DME;Impaired sensation       PT Treatment Interventions DME instruction;Gait training;Functional mobility training;Stair training;Therapeutic activities;Therapeutic exercise;Balance training;Neuromuscular re-education;Patient/family education;Cognitive remediation    PT Goals (Current goals can be found in the Care Plan section)  Acute Rehab PT Goals Patient Stated Goal: to return to independence PT Goal Formulation: With patient/family Time For Goal Achievement: 06/04/24 Potential to Achieve Goals: Good Additional Goals Additional Goal #1: Pt will score >19/24 on the DGI to indicate a reduced risk for falls    Frequency Min 3X/week     Co-evaluation               AM-PAC PT 6 Clicks Mobility  Outcome Measure Help needed turning from your back to your side while in a flat bed without using bedrails?: None Help needed moving from lying on your back to sitting on the side of a flat bed without using bedrails?: None Help needed moving to and from a  bed to a chair (including a wheelchair)?: A Little Help needed standing up from a chair using your arms (e.g., wheelchair or bedside chair)?: A Little Help needed to walk in hospital room?: A Little Help needed climbing 3-5 steps with a railing? : A Little 6 Click Score: 20    End of Session Equipment Utilized During Treatment: Gait belt Activity Tolerance: Patient tolerated treatment well Patient left: in bed;with call bell/phone within reach;with family/visitor present Nurse Communication: Mobility status PT Visit Diagnosis: Other abnormalities of gait and mobility (R26.89);Muscle weakness (generalized) (M62.81);Other symptoms and signs involving the nervous system (R29.898)    Time: 1018-1040 PT Time Calculation (min) (ACUTE ONLY): 22 min   Charges:   PT Evaluation $PT Eval Low Complexity: 1 Low   PT General Charges $$ ACUTE PT VISIT: 1 Visit         Bernardino JINNY Ruth, PT, DPT Acute Rehabilitation Office [phone]   Bernardino JINNY Ruth 05/21/2024, 12:04 PM

## 2024-05-21 NOTE — Plan of Care (Signed)
  Problem: Health Behavior/Discharge Planning: Goal: Ability to manage health-related needs will improve Outcome: Progressing   Problem: Clinical Measurements: Goal: Ability to maintain clinical measurements within normal limits will improve Outcome: Progressing Goal: Will remain free from infection Outcome: Progressing Goal: Diagnostic test results will improve Outcome: Progressing Goal: Respiratory complications will improve Outcome: Progressing Goal: Cardiovascular complication will be avoided Outcome: Progressing   Problem: Activity: Goal: Risk for activity intolerance will decrease Outcome: Progressing   Problem: Nutrition: Goal: Adequate nutrition will be maintained Outcome: Progressing   Problem: Coping: Goal: Level of anxiety will decrease Outcome: Progressing   Problem: Elimination: Goal: Will not experience complications related to bowel motility Outcome: Progressing Goal: Will not experience complications related to urinary retention Outcome: Progressing   Problem: Pain Managment: Goal: General experience of comfort will improve and/or be controlled Outcome: Progressing   Problem: Safety: Goal: Ability to remain free from injury will improve Outcome: Progressing   Problem: Skin Integrity: Goal: Risk for impaired skin integrity will decrease Outcome: Progressing   Problem: Activity: Goal: Ability to return to baseline activity level will improve Outcome: Progressing   Problem: Cardiovascular: Goal: Ability to achieve and maintain adequate cardiovascular perfusion will improve Outcome: Progressing Goal: Vascular access site(s) Level 0-1 will be maintained Outcome: Progressing   Problem: Health Behavior/Discharge Planning: Goal: Ability to safely manage health-related needs after discharge will improve Outcome: Progressing   Problem: Education: Goal: Ability to describe self-care measures that may prevent or decrease complications (Diabetes  Survival Skills Education) will improve Outcome: Progressing Goal: Individualized Educational Video(s) Outcome: Progressing

## 2024-05-21 NOTE — Progress Notes (Signed)
 SLP Cancellation Note  Patient Details Name: Angeliah Wisdom MRN: [id number] DOB: [birth year]   Cancelled treatment:        Pt screened with husband present- both support no changes to speech, language, or cognition. A&Ox4, speech was clear and consistent. No deficits detected, no acute f/u required at this time. SLP signing off.  Manuelita Blew M.S. CCC-SLP

## 2024-05-21 NOTE — Progress Notes (Signed)
  Progress Note  Patient Name: Ashlee [name] Date of Encounter: 05/21/2024 [facility] Cardiologist: Jayson Sierras, MD   Interval Summary   Resting comfortably. Ambulated hallway with assistance.   Vital Signs Vitals:   05/21/24 0600 05/21/24 0630 05/21/24 0700 05/21/24 0800  BP: 111/87 102/76 97/75 (!) 82/59  Pulse: (!) 59 75 71   Resp: 18 (!) 22 20 (!) 21  Temp:   (!) 97 F (36.1 C)   TempSrc:   Axillary   SpO2: 96% 95% 97% 97%  Weight:      Height:        Intake/Output Summary (Last 24 hours) at 05/21/2024 0935 Last data filed at 05/20/2024 2200 Gross per 24 hour  Intake 330 ml  Output 300 ml  Net 30 ml      05/18/2024    4:51 PM 04/20/2024   11:47 AM 04/19/2024   10:38 AM  Last 3 Weights  Weight (lbs) 162 lb 14.7 oz 162 lb 14.7 oz 166 lb 9.6 oz  Weight (kg) 73.9 kg 73.9 kg 75.569 kg      Telemetry/ECG  No adverse rhythms - Personally Reviewed  Physical Exam  GEN: No acute distress.   Neck: No JVD Cardiac: RRR, no murmurs, rubs, or gallops.  Respiratory: Clear to auscultation bilaterally. GI: Soft, nontender, non-distended  MS: No edema  Assessment & Plan   53 year old with non-ST elevation myocardial infarction, LAD stenting, ischemic cardiomyopathy EF 35 to 40% discovered, acute right thalamic stroke 12 mm postoperatively.  CAD/non-STEMI - Continuing with Brilinta  90 mg twice a day, aspirin  81 mg a day, Crestor  20 mg a day LDL goal less than 55.  Ischemic cardiomyopathy, acute systolic heart failure - Blood pressure will not allow ARNI, spironolactone , beta-blocker.  Also, slightly elevated blood pressure in the setting of acute thalamic stroke. Gently add.   Acute thalamic stroke --appreciate neuro consult --MRI reviewed --CTA head neck 1. Negative CTA for large vessel occlusion or other emergent finding. 2. Focal severe distal left P2 stenosis. 3. Otherwise mild for age atheromatous change elsewhere about the major arterial vasculature  of the head and neck. No other hemodynamically significant or correctable stenosis --Walking hallway well Feeling better. Left sided changes in sensation.   Prior provoked PE -- completed 3 months AC earlier this year  Type 2 DM (new dx) --A1c was 7.3 --Will start Farxiga 10mg   OK from my standpoint to transfer to floor    For questions or updates, please contact [facility] Please consult [email or web address] for contact info under         Signed, Oneil Parchment, MD

## 2024-05-22 ENCOUNTER — Telehealth (HOSPITAL_COMMUNITY): Payer: Self-pay | Admitting: Pharmacist

## 2024-05-22 ENCOUNTER — Other Ambulatory Visit (HOSPITAL_COMMUNITY): Payer: Self-pay

## 2024-05-22 ENCOUNTER — Encounter (HOSPITAL_COMMUNITY): Payer: Self-pay | Admitting: Internal Medicine

## 2024-05-22 DIAGNOSIS — Z955 Presence of coronary angioplasty implant and graft: Secondary | ICD-10-CM

## 2024-05-22 DIAGNOSIS — E119 Type 2 diabetes mellitus without complications: Secondary | ICD-10-CM | POA: Diagnosis not present

## 2024-05-22 DIAGNOSIS — I255 Ischemic cardiomyopathy: Secondary | ICD-10-CM

## 2024-05-22 DIAGNOSIS — I639 Cerebral infarction, unspecified: Secondary | ICD-10-CM

## 2024-05-22 DIAGNOSIS — I10 Essential (primary) hypertension: Secondary | ICD-10-CM | POA: Diagnosis not present

## 2024-05-22 DIAGNOSIS — I5041 Acute combined systolic (congestive) and diastolic (congestive) heart failure: Secondary | ICD-10-CM

## 2024-05-22 DIAGNOSIS — I214 Non-ST elevation (NSTEMI) myocardial infarction: Secondary | ICD-10-CM | POA: Diagnosis not present

## 2024-05-22 LAB — GLUCOSE, CAPILLARY
Glucose-Capillary: 114 mg/dL — ABNORMAL HIGH (ref 70–99)
Glucose-Capillary: 129 mg/dL — ABNORMAL HIGH (ref 70–99)
Glucose-Capillary: 132 mg/dL — ABNORMAL HIGH (ref 70–99)

## 2024-05-22 MED ORDER — METFORMIN HCL 500 MG PO TABS
500.0000 mg | ORAL_TABLET | Freq: Two times a day (BID) | ORAL | 2 refills | Status: AC
  Filled 2024-05-22: qty 60, 30d supply, fill #0

## 2024-05-22 MED ORDER — ROSUVASTATIN CALCIUM 40 MG PO TABS
40.0000 mg | ORAL_TABLET | Freq: Every day | ORAL | 3 refills | Status: AC
  Filled 2024-05-22: qty 90, 90d supply, fill #0

## 2024-05-22 MED ORDER — BLOOD GLUCOSE TEST VI STRP
1.0000 | ORAL_STRIP | Freq: Three times a day (TID) | 0 refills | Status: AC
  Filled 2024-05-22: qty 100, 34d supply, fill #0

## 2024-05-22 MED ORDER — LOSARTAN POTASSIUM 25 MG PO TABS
25.0000 mg | ORAL_TABLET | Freq: Every day | ORAL | 2 refills | Status: DC
  Filled 2024-05-22: qty 30, 30d supply, fill #0

## 2024-05-22 MED ORDER — GABAPENTIN 300 MG PO CAPS
300.0000 mg | ORAL_CAPSULE | Freq: Every day | ORAL | 1 refills | Status: AC
  Filled 2024-05-22: qty 30, 30d supply, fill #0

## 2024-05-22 MED ORDER — SPIRONOLACTONE 25 MG PO TABS: 12.5000 mg | ORAL_TABLET | Freq: Every day | ORAL | Status: DC

## 2024-05-22 MED ORDER — ACCU-CHEK SOFTCLIX LANCETS MISC
1.0000 | Freq: Three times a day (TID) | 0 refills | Status: AC
  Filled 2024-05-22: qty 100, 30d supply, fill #0

## 2024-05-22 MED ORDER — BLOOD GLUCOSE MONITOR SYSTEM W/DEVICE KIT
1.0000 | PACK | Freq: Three times a day (TID) | 0 refills | Status: AC
  Filled 2024-05-22: qty 1, 30d supply, fill #0

## 2024-05-22 MED ORDER — LIVING WELL WITH DIABETES BOOK
Freq: Once | Status: AC
  Filled 2024-05-22: qty 1

## 2024-05-22 MED ORDER — DAPAGLIFLOZIN PROPANEDIOL 10 MG PO TABS
10.0000 mg | ORAL_TABLET | Freq: Every day | ORAL | 2 refills | Status: DC
  Filled 2024-05-22: qty 30, 30d supply, fill #0

## 2024-05-22 MED ORDER — LANCET DEVICE MISC
1.0000 | Freq: Three times a day (TID) | 0 refills | Status: AC
  Filled 2024-05-22: qty 1, 30d supply, fill #0

## 2024-05-22 NOTE — Plan of Care (Signed)
  Problem: Health Behavior/Discharge Planning: Goal: Ability to manage health-related needs will improve Outcome: Progressing   Problem: Activity: Goal: Risk for activity intolerance will decrease Outcome: Progressing   Problem: Coping: Goal: Level of anxiety will decrease Outcome: Progressing   Problem: Safety: Goal: Ability to remain free from injury will improve Outcome: Progressing   Problem: Cardiovascular: Goal: Ability to achieve and maintain adequate cardiovascular perfusion will improve Outcome: Progressing Goal: Vascular access site(s) Level 0-1 will be maintained Outcome: Progressing   Problem: Ischemic Stroke/TIA Tissue Perfusion: Goal: Complications of ischemic stroke/TIA will be minimized Outcome: Progressing

## 2024-05-22 NOTE — Inpatient Diabetes Management (Signed)
 Inpatient Diabetes Program Recommendations  AACE/ADA: New Consensus Statement on Inpatient Glycemic Control (2015)  Target Ranges:  Prepandial:   less than 140 mg/dL      Peak postprandial:   less than 180 mg/dL (1-2 hours)      Critically ill patients:  140 - 180 mg/dL   Lab Results  Component Value Date   GLUCAP 114 (H) 05/22/2024   HGBA1C 7.3 (H) 05/19/2024   Diabetes history: New Onset DM2 Current orders for Inpatient glycemic control: Farxiga 10 mg daily Novolog 0-15 units tid, 0-5 units hs  Inpatient Diabetes Program Recommendations:   Noted patient new onset Diabetes type 2. Ordered Living Well With Diabetes booklet and dietician consult. Spoke with pt and husband about new diagnosis. Discussed A1C results with them and explained what an A1C is, basic pathophysiology of DM Type 2, basic home care, basic diabetes diet nutrition principles, importance of checking CBGs and maintaining good CBG control to prevent long-term and short-term complications. Reviewed signs and symptoms of hyperglycemia and hypoglycemia and how to treat hypoglycemia at home. Also reviewed blood sugar goals at home.  RNs to provide ongoing basic DM education at bedside with this patient.   Reviewed basic plate method nutrition with patient. Patient states her weakness is bread but has been buying more products of low carb. Gave pt. Handout on healthy meal planning.  Requested glucose meter and supplies order for discharge. Dr. Darci placed orders.  Thank you, Durene Dodge E. Caidynce Muzyka, RN, MSN, CNS, CDCES  Diabetes Coordinator Inpatient Glycemic Control Team Team Pager [phone] (8am-5pm) 05/22/2024 1:42 PM

## 2024-05-22 NOTE — Telephone Encounter (Signed)
 PHARMACIST LIPID MONITORING   Ashlee Brown is a 53 y.o. y.o. adult admitted on with NSTEMI and CVA.  Pharmacy has been consulted to optimize lipid-lowering therapy with the indication of secondary prevention for clinical ASCVD.    Recent Labs: Lipid Panel:          Component Value Date/Time    CHOL 132 05/20/2024 0328    TRIG 146 05/20/2024 0328    HDL 31 (L) 05/20/2024 0328    CHOLHDL 4.3 05/20/2024 0328    VLDL 29 05/20/2024 0328    LDLCALC 72 05/20/2024 0328    LDLCALC 66 04/13/2024 0905      Hepatic function panel:          Component Value Date/Time    AST 42 (H) 05/18/2024 1805    ALT 45 (H) 05/18/2024 1805    ALKPHOS 74 05/18/2024 1805    BILITOT 0.8 05/18/2024 1805      Serum Creatinine     Creat (mg/dL)  Date Value  88/79/7976 0.83       Creatinine, Ser (mg/dL)  Date Value  90/72/7974 0.83    Estimated CrCl of Estimated Creatinine Clearance: 73.8 mL/min (by C-G formula based on SCr of 0.83 mg/dL).   Current therapy and lipid therapy tolerance: Current lipid-lowering therapy: Rosuvastatin  40mg /day (increased from 20mg /d)  Previous lipid-lowering therapies (if applicable): Pravastatin   Documented or reported allergies or intolerances to lipid-lowering therapies (if applicable): none    Assessment:    Patient is  Statin-tolerant Baseline LDL: 72 Major ASCVD events: NSTEMI, CVA   High-Risk Conditions: Diabetes mellitus and Hypertension   Plan:    Statin intensity: No statin changes.  The patient is already on high intensity statin. Refer to lipid clinic:   Yes

## 2024-05-22 NOTE — Progress Notes (Signed)
 The patient has met all goals per MD orders and is ready for discharge. Patient in agreement, being discharged home to self/caregiver under care of physician. Discharge summary paperwork given, explained and reviewed. Patient left with all belongings in stable conditions.

## 2024-05-22 NOTE — Progress Notes (Signed)
 Physical Therapy Treatment Patient Details Name: Ashlee Brown MRN: [id number] DOB: 1970/10/11 Today's Date: 05/22/2024   History of Present Illness 53 y.o. female presents to [facility] hospital on 05/18/2024 with chest pain. Pt admitted with concern for NSTEMI. Pt underwent heart cath with stent placement in LAD on 9/26. Pt reported new L numbness on 9/27, MRI with finding of R thalamic infarct. PMH includes CAD, HTN, HLD, PE.    PT Comments  Pt making excellent progress towards her physical therapy goals and is highly motivated. Emphasis on gait training this session with achieving L heel strike at initial contact with hall railing vs RW. Pt negotiated stairs with bilateral railings to simulate home set up. Discussed appropriate DME, home set up, and proper foot wear. Pt continues with L sided weakness, incoordination, impaired balance, gait abnormalities. Recommend OPPT to maximize neuro-recovery and independence.     If plan is discharge home, recommend the following: A little help with walking and/or transfers;A little help with bathing/dressing/bathroom;Assistance with cooking/housework;Assist for transportation;Help with stairs or ramp for entrance   Can travel by private vehicle        Equipment Recommendations  Rolling walker (2 wheels)    Recommendations for Other Services       Precautions / Restrictions Precautions Precautions: Fall Recall of Precautions/Restrictions: Intact Restrictions Weight Bearing Restrictions Per Provider Order: No     Mobility  Bed Mobility Overal bed mobility: Independent                  Transfers Overall transfer level: Modified independent Equipment used: Rolling walker (2 wheels), None                    Ambulation/Gait Ambulation/Gait assistance: Min assist, Contact guard assist Gait Distance (Feet): 250 Feet Assistive device: Rolling walker (2 wheels), None (R railing) Gait Pattern/deviations: Step-through pattern, Decreased  dorsiflexion - left, Narrow base of support Gait velocity: reduced     General Gait Details: Pt initially ambulated to sink with no AD and minA for balance, ambulated hallway distance with RW vs R hall rail, overall CGA, one lateral LOB when returning to room which pt was able to self correct. Verbal cues for L heel strike at initial contact, wider BOS   Stairs Stairs: Yes Stairs assistance: Contact guard assist Stair Management: Two rails Number of Stairs: 5 General stair comments: Step over step ascending, step by step descending   Wheelchair Mobility     Tilt Bed    Modified Rankin (Stroke Patients Only) Modified Rankin (Stroke Patients Only) Pre-Morbid Rankin Score: No symptoms Modified Rankin: Moderately severe disability     Balance Overall balance assessment: Needs assistance Sitting-balance support: No upper extremity supported, Feet supported Sitting balance-Leahy Scale: Good     Standing balance support: Single extremity supported, Reliant on assistive device for balance Standing balance-Leahy Scale: Poor                              Communication Communication Communication: No apparent difficulties  Cognition Arousal: Alert Behavior During Therapy: WFL for tasks assessed/performed   PT - Cognitive impairments: Awareness                         Following commands: Intact      Cueing Cueing Techniques: Verbal cues  Exercises Other Exercises Other Exercises: Backwards walking with R rail x 10 ft    General Comments  Pertinent Vitals/Pain Pain Assessment Pain Assessment: No/denies pain    Home Living Family/patient expects to be discharged to:: Private residence Living Arrangements: Spouse/significant other;Children Available Help at Discharge: Family;Available PRN/intermittently Type of Home: House Home Access: Level entry     Alternate Level Stairs-Number of Steps: FF Home Layout: Two level;Able to live on  main level with bedroom/bathroom Home Equipment: None      Prior Function            PT Goals (current goals can now be found in the care plan section) Acute Rehab PT Goals Patient Stated Goal: to return to independence Potential to Achieve Goals: Good Progress towards PT goals: Progressing toward goals    Frequency    Min 3X/week      PT Plan      Co-evaluation              AM-PAC PT 6 Clicks Mobility   Outcome Measure  Help needed turning from your back to your side while in a flat bed without using bedrails?: None Help needed moving from lying on your back to sitting on the side of a flat bed without using bedrails?: None Help needed moving to and from a bed to a chair (including a wheelchair)?: A Little Help needed standing up from a chair using your arms (e.g., wheelchair or bedside chair)?: A Little Help needed to walk in hospital room?: A Little Help needed climbing 3-5 steps with a railing? : A Little 6 Click Score: 20    End of Session Equipment Utilized During Treatment: Gait belt Activity Tolerance: Patient tolerated treatment well Patient left: in bed;with call bell/phone within reach;with bed alarm set;with family/visitor present   PT Visit Diagnosis: Other abnormalities of gait and mobility (R26.89);Muscle weakness (generalized) (M62.81);Other symptoms and signs involving the nervous system (R29.898)     Time: 8865-8797 PT Time Calculation (min) (ACUTE ONLY): 28 min  Charges:    $Gait Training: 8-22 mins $Therapeutic Activity: 8-22 mins PT General Charges $$ ACUTE PT VISIT: 1 Visit                     Ashlee Brown, PT, DPT Acute Rehabilitation Services Office [phone]    Ashlee Brown 05/22/2024, 12:09 PM

## 2024-05-22 NOTE — Evaluation (Addendum)
 Occupational Therapy Evaluation Patient Details Name: Ashlee Brown MRN: [id number] DOB: [birth year] Today's Date: 05/22/2024   History of Present Illness   53 y.o. female presents to [facility] hospital on 05/18/2024 with chest pain. Pt admitted with concern for NSTEMI. Pt underwent heart cath with stent placement in LAD on 9/26. Pt reported new L numbness on 9/27, MRI with finding of R thalamic infarct. PMH includes CAD, HTN, HLD, PE.     Clinical Impressions Pt admitted for the above details. Pt greeted in supine, agreeable for OT visit. Supportive spouse at bedside. PTA, pt was indep with ADLs, IADLs, medication mgmt, and mobility without AD. She presents today with mild LUE>RUE weakness and LHB sensory and coordination deficits. Functionally, she required no more than CGA for functional mobility + RW and standing UB ADLs/seated LB ADLs. Pt rather tearful during session, benefits from reassurance.   Pt is currently functioning below baseline and would benefit from ongoing acute OT services to progress towards safe discharge and to facilitate return to prior level of function. Current recommendation is home with outpatient OT with neuro rehab focus.      If plan is discharge home, recommend the following:   A little help with bathing/dressing/bathroom;Assistance with cooking/housework;Assist for transportation;Other (comment) (assistance with tub/shower transfers)     Functional Status Assessment   Patient has had a recent decline in their functional status and demonstrates the ability to make significant improvements in function in a reasonable and predictable amount of time.     Equipment Recommendations   Tub/shower bench     Recommendations for Other Services         Precautions/Restrictions   Precautions Precautions: Fall Recall of Precautions/Restrictions: Intact Restrictions Weight Bearing Restrictions Per Provider Order: No     Mobility Bed Mobility Overal bed  mobility: Modified Independent                  Transfers Overall transfer level: Needs assistance Equipment used: Rolling walker (2 wheels), None Transfers: Sit to/from Stand Sit to Stand: Supervision           General transfer comment: Cued for hand placement      Balance Overall balance assessment: Needs assistance Sitting-balance support: No upper extremity supported, Feet supported Sitting balance-Leahy Scale: Good     Standing balance support: Bilateral upper extremity supported, During functional activity, Reliant on assistive device for balance Standing balance-Leahy Scale: Poor Standing balance comment: Pt intermittently misplacing feet within frame of RW, cues to maintain appropriate proximity to prevent LOB                           ADL either performed or assessed with clinical judgement   ADL Overall ADL's : Needs assistance/impaired     Grooming: Contact guard assist;Wash/dry hands;Standing Grooming Details (indicate cue type and reason): assist for stability             Lower Body Dressing: Supervision/safety;Sitting/lateral leans (inc time/effort) Lower Body Dressing Details (indicate cue type and reason): donned B socks via modified figure four technique Toilet Transfer: Contact guard Nurse, adult Details (indicate cue type and reason): stood from toilet without use of grab bars     Tub/ Shower Transfer: Minimal assistance;Ambulation;BSC/3in1 Tub/Shower Transfer Details (indicate cue type and reason): stability and safety when simulating stepping over threshold of tub, cued for safe technique         Vision Baseline Vision/History: 1 Wears glasses Ability to See  in Adequate Light: 0 Adequate Patient Visual Report: No change from baseline Vision Assessment?: No apparent visual deficits     Perception         Praxis         Pertinent Vitals/Pain Pain Assessment Pain Assessment:  No/denies pain     Extremity/Trunk Assessment Upper Extremity Assessment Upper Extremity Assessment: LUE deficits/detail LUE Deficits / Details: grip strength ~4+/5, elbow flexion/extension ~4/5, shoulder flexion ~4/5 LUE Sensation: decreased light touch LUE Coordination: decreased fine motor   Lower Extremity Assessment Lower Extremity Assessment: Defer to PT evaluation   Cervical / Trunk Assessment Cervical / Trunk Assessment: Normal   Communication Communication Communication: No apparent difficulties   Cognition Arousal: Alert Behavior During Therapy: Lability, Anxious (tearful & emotionally labile, but very participatory) Cognition: No apparent impairments             OT - Cognition Comments: pt very tearful with OT, reassurance provided throughout session                 Following commands: Intact       Cueing  General Comments   Cueing Techniques: Verbal cues;Visual cues  supportive husband at bedside throughout session   Exercises     Shoulder Instructions      Home Living Family/patient expects to be discharged to:: Private residence Living Arrangements: Spouse/significant other;Children Available Help at Discharge: Family;Available PRN/intermittently Type of Home: House Home Access: Level entry     Home Layout: Two level;Able to live on main level with bedroom/bathroom Alternate Level Stairs-Number of Steps: FF Alternate Level Stairs-Rails: Right Bathroom Shower/Tub: Chief Strategy Officer: Standard Bathroom Accessibility: Yes How Accessible: Accessible via walker Home Equipment: None          Prior Functioning/Environment Prior Level of Function : Independent/Modified Independent;Driving             Mobility Comments: No DME usage PTA ADLs Comments: indep    OT Problem List: Decreased strength;Impaired balance (sitting and/or standing);Decreased coordination;Impaired sensation;Impaired UE functional use   OT  Treatment/Interventions: Self-care/ADL training;Neuromuscular education;Energy conservation;Therapeutic activities;Patient/family education;Balance training      OT Goals(Current goals can be found in the care plan section)   Acute Rehab OT Goals Patient Stated Goal: to get back to normal OT Goal Formulation: With patient/family Time For Goal Achievement: 06/05/24 Potential to Achieve Goals: Good   OT Frequency:  Min 2X/week    Co-evaluation              AM-PAC OT 6 Clicks Daily Activity     Outcome Measure Help from another person eating meals?: None Help from another person taking care of personal grooming?: A Little Help from another person toileting, which includes using toliet, bedpan, or urinal?: None Help from another person bathing (including washing, rinsing, drying)?: A Little Help from another person to put on and taking off regular upper body clothing?: None Help from another person to put on and taking off regular lower body clothing?: A Little 6 Click Score: 21   End of Session Equipment Utilized During Treatment: Gait belt;Rolling walker (2 wheels)  Activity Tolerance: Patient tolerated treatment well Patient left: in bed;with family/visitor present;with call bell/phone within reach  OT Visit Diagnosis: Unsteadiness on feet (R26.81);Other symptoms and signs involving the nervous system (R29.898)                Time: 8942-8867 OT Time Calculation (min): 35 min Charges:  OT General Charges $OT Visit: 1 Visit OT Evaluation $  OT Eval Low Complexity: 1 Low OT Treatments $Self Care/Home Management : 8-22 mins  Durenda Pechacek D., MSOT, OTR/L Acute Rehabilitation Services [phone] Secure Chat Preferred  Ashlee Brown 05/22/2024, 2:37 PM

## 2024-05-22 NOTE — Progress Notes (Signed)
 Heart Failure Nurse Navigator Progress Note  PCP: Katrinka Aquas, MD PCP-Cardiologist: Debera Admission Diagnosis: NSTEMI Admitted from: Home  Presentation:   Ashlee Brown presented with persistent upper mid chest pain, and a burning sensation. Began a treatment of Augmentin earlier this month, but hasn't helped. BP 156/101, HR 112, Troponin 2,789, EKG with Sinus tachycardia Inferior infarct , age undetermined Anterolateral infarct (cited on or before 18-May-2024) Abnormal. CXR with no acute cardiopulmonary abnormality, CT - No PE. Had left heart cath on 05/19/24, with successful PCI to the Mid LAD, after cath patient experienced left sided sensory deficits, neuro consulted. MRI of the brain which demonstrated a right lateral thalamus infarct.   Patient was educated on the sign and symptoms of heart failure, daily weights, when to call her doctor or go to the ED. Diet/ fluid restrictions, taking all medications as prescribed and attending all medical appointments. Patient verbalized her understanding of all education. A referral to HF TOC was placed on 05/22/2024. Due to patient has Out of [employer]. Patient made aware and understands the process.   ECHO/ LVEF: 35-40%  Clinical Course:  Past Medical History:  Diagnosis Date   Hyperlipidemia    Hypertension      Social History   Socioeconomic History   Marital status: Married    Spouse name: Not on file   Number of children: Not on file   Years of education: Not on file   Highest education level: Not on file  Occupational History   Not on file  Tobacco Use   Smoking status: Never    Passive exposure: Never   Smokeless tobacco: Never  Vaping Use   Vaping status: Never Used  Substance and Sexual Activity   Alcohol use: Yes    Comment: Occasionaly   Drug use: Never   Sexual activity: Yes  Other Topics Concern   Not on file  Social History Narrative   Not on file   Social Drivers of Health   Financial Resource  Strain: Not on file  Food Insecurity: No Food Insecurity (05/21/2024)   Hunger Vital Sign    Worried About Running Out of Food in the Last Year: Never true    Ran Out of Food in the Last Year: Never true  Transportation Needs: No Transportation Needs (05/21/2024)   PRAPARE - Administrator, Civil Service (Medical): No    Lack of Transportation (Non-Medical): No  Physical Activity: Not on file  Stress: Not on file  Social Connections: Not on file   Education Assessment and Provision:  Detailed education and instructions provided on heart failure disease management including the following:  Signs and symptoms of Heart Failure When to call the physician Importance of daily weights Low sodium diet Fluid restriction Medication management Anticipated future follow-up appointments  Patient education given on each of the above topics.  Patient acknowledges understanding via teach back method and acceptance of all instructions.  Education Materials:  Living Better With Heart Failure Booklet, HF zone tool, & Daily Weight Tracker Tool.  Patient has scale at home: Yes Patient has pill box at home: Yes    High Risk Criteria for Readmission and/or Poor Patient Outcomes: Heart failure hospital admissions (last 6 months): 0  No Show rate: 0 Difficult social situation: No, lives her Husband Demonstrates medication adherence: Yes Primary Language: English  Literacy level: Reading, writing, and comprehension   Barriers of Care:   New HF  Diet/ fluid restrictions (salt)  Daily weights  Considerations/Referrals:  Referral made to Heart Failure Pharmacist Stewardship: NA Referral made to Heart Failure CSW/NCM TOC: NA Referral made to Heart & Vascular TOC clinic: Yes, a referral was placed for prior auth from patients insurance on 05/22/2024,  which is out of Network.   Items for Follow-up on DC/TOC: Continued HF education Diet/ fluid restrictions/ daily weights   Stephane Haddock, BSN, RN Heart Failure Print production planner Chat Only

## 2024-05-22 NOTE — Progress Notes (Signed)
  Progress Note  Patient Name: Shanitha Twining Date of Encounter: 05/22/2024 [facility] Cardiologist: Jayson Sierras, MD   Interval Summary   Resting comfortably. Ambulated well - no issues besides L leg weakness.  No further CP or dyspnea.  Vital Signs Vitals:   05/21/24 2259 05/22/24 0333 05/22/24 0700 05/22/24 1233  BP: 115/89 120/85 (!) 131/98 117/79  Pulse: 89 95 97 78  Resp: 18 18 18 19   Temp: 98.2 F (36.8 C) 98.9 F (37.2 C) 98 F (36.7 C) 97.7 F (36.5 C)  TempSrc: Oral Oral Oral Oral  SpO2: 99% 99% 99% 98%  Weight:      Height:       No intake or output data in the 24 hours ending 05/22/24 1407     05/18/2024    4:51 PM 04/20/2024   11:47 AM 04/19/2024   10:38 AM  Last 3 Weights  Weight (lbs) 162 lb 14.7 oz 162 lb 14.7 oz 166 lb 9.6 oz  Weight (kg) 73.9 kg 73.9 kg 75.569 kg      Telemetry/ECG  No adverse rhythms - Personally Reviewed  Physical Exam  GEN: No acute distress.   Neck: No JVD Cardiac: RRR, no murmurs, rubs, or gallops.  Respiratory: Clear to auscultation bilaterally. 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  non-STEMI - CAD s/p PCI - Continuing DAPT: asa 81 mg & Brilinta  90 mg BID, along with Crestor  20 mg a day LDL goal less than 55.  Ischemic cardiomyopathy, acute systolic heart failure - We have been allowing for mild permissive hypertension and therefore ARB/ARNI spironolactone  and beta-blockers have been held.  She is now back on beta-blocker would recommend converting from Lopressor  to Toprol  consolidating dose.  - If BP will tolerate would add low-dose losartan 12.5 to 25 mg daily depending on blood pressures, and consider adding spironolactone  12.5 mg daily.  Recently  titrated up in the outpatient setting. - She is also on Farxiga.  Acute thalamic stroke --appreciate neuro consult --MRI reviewed --CTA head neck 1. Negative CTA for large vessel occlusion or other emergent finding. 2. Focal severe distal left P2 stenosis. 3. Otherwise mild for age atheromatous change elsewhere about the major arterial vasculature of the head and neck. No other hemodynamically significant or correctable stenosis --Walking hallway well Feeling better. Left sided changes in sensation.   Prior provoked PE -- completed 3 months AC earlier this year  Type 2 DM (new dx) --A1c was 7.3 --Will start Farxiga 10mg   OK from my standpoint to transfer to floor    For questions or updates, please contact [facility] Please consult [email or web address] for contact info under      My understanding is a plan is berry be discharged today.  We will arrange outpatient follow-up with Dr. McDowell/APP in the [place] office.  Medications given for titrated in the outpatient setting.   Signed, Alm Clay, MD

## 2024-05-22 NOTE — Progress Notes (Signed)
 Pt ambulated with PT. Discussed with pt and husband MI, stents, restrictions, Brilinta  importance, diet, exercise, NTG and CRPII. Pt receptive but tearful. Also gave reminders for daily wts. Will refer to [facility] at pts request. [id number] Aliene Aris BS, ACSM-CEP 05/22/2024 1:00 PM

## 2024-05-22 NOTE — TOC Transition Note (Addendum)
 Transition of Care [facility]) - Discharge Note   Patient Details  Name: Ashlee Brown MRN: [id number] Date of Birth: 18-Jan-1971  [facility]) CM/SW Contact:  Andrez JULIANNA George, RN Phone Number: 05/22/2024, 11:49 AM   Clinical Narrative:     Pt is discharging home with outpatient therapy referral sent to [facility]. Information on the AVS.  Pt has support from spouse and he can provide needed transportation. Pt manages her own medications and spouse can assist.  Walker ordered through [employer] and will be delivered to the room.  Spouse will transport home.  1220: CM provided her coupon card for Brilinta .  Final next level of care: OP Rehab Barriers to Discharge: No Barriers Identified   Patient Goals and CMS Choice   CMS Medicare.gov Compare Post Acute Care list provided to:: Patient Choice offered to / list presented to : Patient, Spouse      Discharge Placement                       Discharge Plan and Services Additional resources added to the After Visit Summary for                  DME Arranged: Walker rolling DME Agency: [facility] Date DME Agency Contacted: 05/22/24   Representative spoke with at DME Agency: [place]            Social Drivers of Health (SDOH) Interventions SDOH Screenings   Food Insecurity: No Food Insecurity (05/21/2024)  Housing: Low Risk  (05/21/2024)  Transportation Needs: No Transportation Needs (05/21/2024)  Utilities: Not At Risk (05/21/2024)  Depression (PHQ2-9): Low Risk  (04/20/2024)  Tobacco Use: Low Risk  (05/18/2024)     Readmission Risk Interventions     No data to display

## 2024-05-22 NOTE — Discharge Summary (Incomplete)
 Physician Discharge Summary   Patient: Ashlee Brown MRN: [id number] DOB: 1971/01/10  Admit date:     05/18/2024  Discharge date: {dischdate:26783}  Discharge Physician: Concepcion Riser   PCP: Katrinka Aquas, MD   Recommendations at discharge:  {Tip this will not be part of the note when signed- Example include specific recommendations for outpatient follow-up, pending tests to follow-up on. (Optional):26781}  PCP follow up in 1 week. Neurology follow up as scheduled. Cardiology follow up suggested.  Discharge Diagnoses: Principal Problem:   NSTEMI (non-ST elevated myocardial infarction) [facility]) Active Problems:   Acute ischemic stroke [name])   New onset type 2 diabetes mellitus (HCC)   Essential hypertension   Status post insertion of drug-eluting stent into left anterior descending artery  Resolved Problems:   * No resolved hospital problems. *  Hospital Course: Ashlee Brown is a 53 y.o. female with hx of CAD by imaging, HTN, HLD, Hx provoked PE, completed 3 months of AC, who presents with chest pain. Reports onset of R parasternal chest pain about 4 weeks ago, described as sharp, lasting for about 5 minutes. Mainly she noted after eating foods/ drinking liquid, noting worse with cold. She thought may be related to esophagitis. However, she has had escalating pattern and severity of pain, especially over the past 4 days. She has begun having exertional component to pain which is relieved with rest. Pain is severe 8/10. Relieved with NTG tab in the ED. She was not taking aspirin  prior to this admission, no hx of MI in the past.   Patient's troponin jumped to 2500, discussed with cardiologist who advised transfer to [facility] for further evaluation.  Patient is started on heparin  drip, home dose Crestor , metoprolol .  Patient is evaluated by cardiologist, cardiac cath procedure done with stents placed in LAD.  S/p stents she did complain of left-sided numbness, weakness.  Neurology  evaluated advised MRI brain which did show acute thalamic ischemic stroke.  Her A1c 7.3, LDL 72.  She is continued on aspirin , Brilinta  post cath.  She is started on insulin regimen, Crestor  increased to 40 mg.  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  Stroke education provided.  Diabetes education provided.  Patient is hemodynamically stable to be discharged home.  I advised her to follow-up with PCP, neurology and cardiology upon discharge.  She understands and agrees with the discharge plan.   Assessment and Plan: NSTEMI S/p cardiac cath- mid LAD DES placed 05/19/24. Continue aspirin , brillinta, beta blocker, statin therapy. Cardiology team follow up appreciated. Transfer to tele medical floor.   Acute right thalamic stroke Left-sided sensory deficits post cath. MRI brain reviewed. CTA head and neck no LVO. LDL 72, A1c 7.3. Continue aspirin , brillinta, statin therapy. Neurology advised risk factor modification, neurosurgery as outpatient for cervical DJD. PT/ OT evaluation for dc planning. Stroke education.   New onset diabetes- A1c 7.3. Started metformin therapy. Diabetes education provided Discussed about heart healthy carb consistent diet. Advised to keep a log of blood sugars for PCP to adjust medications.   Hypertension- Continue metoprolol , resumed benazepril  and spironolactone .   Hyperlipidemia Crestor  increased to 40 daily.   GERD Continue PPI   H/o of provoked PE CT angiogram done this [facility] in [facility] demonstrated negative for pulmonary embolism. She finished 3 months of anticoagulation.      {Tip this will not be part of the note when signed Body mass index is 29.8 kg/m. , ,  (Optional):26781}  {(NOTE) Pain control PDMP Statment (Optional):26782}  Consultants: Cardiology, neurology Procedures performed: cardiac cath, LAD stents.  Disposition: Home Diet recommendation:  Discharge Diet Orders (From admission, onward)     Start      Ordered   05/22/24 0000  Diet - low sodium heart healthy        05/22/24 1136   05/22/24 0000  Diet Carb Modified        05/22/24 1136           Cardiac and Carb modified diet DISCHARGE MEDICATION: Allergies as of 05/22/2024       Reactions   Sulfamethoxazole Other (See Comments)   Blood clots   Trimethoprim Other (See Comments)   Blood clots   Escitalopram Oxalate Other (See Comments)   Unknown    Lisinopril Other (See Comments)   Unknown    Naltrexone-bupropion Hcl Er Other (See Comments)   Unknown         Medication List     STOP taking these medications    benazepril  20 MG tablet Commonly known as: LOTENSIN        TAKE these medications    Accu-Chek Softclix Lancets lancets Use in the morning, at noon, and at bedtime.   Aspirin  Low Dose 81 MG chewable tablet Generic drug: aspirin  Chew 1 tablet (81 mg total) by mouth daily.   Blood Glucose Monitor System w/Device Kit Use in the morning, at noon, and at bedtime.   BLOOD GLUCOSE TEST STRIPS Strp Use to check blood sugar in the morning, at noon, and at bedtime.   esomeprazole 20 MG capsule Commonly known as: NEXIUM Take 20 mg by mouth daily at 12 noon.   Farxiga 10 MG Tabs tablet Generic drug: dapagliflozin propanediol Take 1 tablet (10 mg total) by mouth daily before breakfast.   gabapentin 300 MG capsule Commonly known as: NEURONTIN Take 1 capsule (300 mg total) by mouth at bedtime.   Lancet Device Misc Use in the morning, at noon, and at bedtime.   losartan 25 MG tablet Commonly known as: Cozaar Take 1 tablet (25 mg total) by mouth daily.   metFORMIN 500 MG tablet Commonly known as: GLUCOPHAGE Take 1 tablet (500 mg total) by mouth 2 (two) times daily with a meal.   metoprolol  succinate 25 MG 24 hr tablet Commonly known as: TOPROL -XL TAKE ONE TABLET BY MOUTH ONCE DAILY   rosuvastatin  40 MG tablet Commonly known as: CRESTOR  Take 1 tablet (40 mg total) by mouth daily. What  changed:  medication strength how much to take   spironolactone  25 MG tablet Commonly known as: ALDACTONE  Take 0.5 tablets (12.5 mg total) by mouth daily. What changed: how much to take   ticagrelor  90 MG Tabs tablet Commonly known as: BRILINTA  Take 1 tablet (90 mg total) by mouth 2 (two) times daily.               Durable Medical Equipment  (From admission, onward)           Start     Ordered   05/22/24 1029  For home use only DME Walker rolling  Once       Question Answer Comment  Walker: With 5 Inch Wheels   Patient needs a walker to treat with the following condition Weakness      05/22/24 1029            Follow-up Information     [facility]. Schedule an appointment as soon as possible for a visit in 1 month(s).  Specialty: Neurology Why: stroke clinic Contact information: [address] [place]  [address] [phone]        Lanis Pupa, MD. Schedule an appointment as soon as possible for a visit in 1 month(s).   Specialty: Neurosurgery Contact information: [address] [place] [address] [phone]         Katrinka Aquas, MD Follow up in 1 week(s).   Specialty: Internal Medicine Contact information: [address] [place]  [address] [place] [address] [phone]         [facility]. Schedule an appointment as soon as possible for a visit.   Contact information: [address]  [place], [place] [phone]               Discharge Exam: Filed Weights   05/18/24 1651  Weight: 73.9 kg   ***  Condition at discharge: stable  The results of significant diagnostics from this hospitalization (including imaging, microbiology, ancillary and laboratory) are listed below for reference.   Imaging Studies: CT ANGIO HEAD NECK W WO CM Result Date: 05/21/2024 CLINICAL DATA:  Initial evaluation for acute neuro deficit, stroke  suspected. EXAM: CT ANGIOGRAPHY HEAD AND NECK WITH AND WITHOUT CONTRAST TECHNIQUE: Multidetector CT imaging of the head and neck was performed using the standard protocol during bolus administration of intravenous contrast. Multiplanar CT image reconstructions and MIPs were obtained to evaluate the vascular anatomy. Carotid stenosis measurements (when applicable) are obtained utilizing NASCET criteria, using the distal internal carotid diameter as the denominator. RADIATION DOSE REDUCTION: This exam was performed according to the departmental dose-optimization program which includes automated exposure control, adjustment of the mA and/or kV according to patient size and/or use of iterative reconstruction technique. CONTRAST:  75mL OMNIPAQUE  IOHEXOL  350 MG/ML SOLN COMPARISON:  Prior CT and MRI from 05/20/2024. FINDINGS: CTA NECK FINDINGS Aortic arch: Visualized aortic arch within normal limits for caliber standard branch pattern. Mild aortic atherosclerosis. No significant stenosis about the origin the great vessels. Right carotid system: Right common and internal carotid arteries are patent without dissection. Mild atheromatous change about the right carotid bulb without hemodynamically significant greater than 50% stenosis. Left carotid system: Left common and internal carotid arteries are patent without dissection. Mild atheromatous change about the left carotid bulb without hemodynamically significant greater than 50% stenosis. Vertebral arteries: Both vertebral arteries arise from subclavian arteries. No proximal subclavian artery stenosis. Vertebral arteries are patent without stenosis or dissection. Skeleton: No worrisome osseous lesions. Other neck: Small area of stranding noted involving the subcutaneous fat of the left lateral lower face (series 7, image 212). Finding is nonspecific, but could reflect sequelae of trauma, contusion or possibly localized infection. No other acute finding. Upper chest: 4 mm  left upper lobe pulmonary nodule (series 5, image 10). Review of the MIP images confirms the above findings CTA HEAD FINDINGS Anterior circulation: Both internal carotid arteries are patent through the siphons to the termini without stenosis or other abnormality. A1 segments patent bilaterally. Normal anterior communicating artery complex. Anterior cerebral arteries patent without stenosis. No M1 stenosis or occlusion. No proximal MCA branch occlusion or high-grade stenosis. Distal MCA branches perfused and symmetric. Posterior circulation: Both V4 segments patent without stenosis. Both PICA patent. Basilar patent without stenosis. Superior cerebral arteries patent bilaterally. Both PCAs primarily supplied via the basilar. Mild atheromatous irregularity about the right PCA without hemodynamically significant stenosis. Focal severe distal left P2 stenosis (series 10, image 23). Left PCA remains patent to its  distal aspect. Venous sinuses: Patent allowing for timing the contrast bolus. Anatomic variants: None significant.  No aneurysm. Review of the MIP images confirms the above findings IMPRESSION: 1. Negative CTA for large vessel occlusion or other emergent finding. 2. Focal severe distal left P2 stenosis. 3. Otherwise mild for age atheromatous change elsewhere about the major arterial vasculature of the head and neck. No other hemodynamically significant or correctable stenosis. 4. Small area of fat stranding involving the subcutaneous fat of the left lower face as above. Finding is nonspecific, with primary considerations including sequelae of trauma/contusion or possibly localized infection. Correlation with physical exam recommended. 5. 4 mm left upper lobe pulmonary nodule, indeterminate. Per Fleischner Society Guidelines,if patient is low risk for malignancy, no routine follow-up imaging is recommended. If patient is high risk for malignancy, a non-contrast Chest CT at 12 months is optional. If performed and  the nodule is stable at 12 months, no further follow-up is recommended. These guidelines do not apply to immunocompromised patients and patients with cancer. Follow up in patients with significant comorbidities as clinically warranted. For lung cancer screening, adhere to Lung-RADS guidelines. Reference: Radiology. 2017; 284(1):228-43. Aortic Atherosclerosis (ICD10-I70.0) Electronically Signed   By: Morene Hoard M.D.   On: 05/21/2024 04:15   MR CERVICAL SPINE WO CONTRAST Result Date: 05/20/2024 EXAM: MRI CERVICAL SPINE WITHOUT CONTRAST 05/20/2024 05:19:01 PM TECHNIQUE: Multiplanar multisequence MRI of the cervical spine was performed. COMPARISON: None available. CLINICAL HISTORY: Cervical radiculopathy, no red flags. FINDINGS: BONES AND ALIGNMENT: Straightening of the normal cervical lordosis is present. Normal vertebral body heights. Bone marrow signal is unremarkable. SPINAL CORD: Normal spinal cord size. No abnormal spinal cord signal. SOFT TISSUES: No paraspinal mass. C2-C3: No significant disc herniation. No spinal canal stenosis or neural foraminal narrowing. C3-C4: Uncovertebral and facet hypertrophy present bilaterally with moderate foraminal narrowing bilaterally. C4-C5: A broad-based disc osteophyte complex distorts the ventral surface of the cord and narrows the canal to 6 mm. Moderate foraminal stenosis is present bilaterally. C5-C6: A broad based disc osteophyte partially effaces the ventral CSF. Severe right and mild left foraminal stenosis is present. C6-C7: A shallow soft disc protrusion is present without significant stenosis. C7-T1: Mild facet hypertrophy is worse on the left. No focal disc protrusion or stenosis is present. IMPRESSION: 1. Broad-based disc osteophyte complex at C4-5 distorting the ventral surface of the cord and narrowing the canal to 6 mm, with moderate foraminal stenosis bilaterally. No cord signal abnormality is present. 2. Broad-based disc osteophyte at C5-6  partially effacing the ventral CSF, with severe right and mild left foraminal stenosis. 3. Shallow soft disc protrusion at C6-7 without significant stenosis. 4. Uncovertebral and facet hypertrophy at C3-4 with moderate foraminal narrowing bilaterally. Electronically signed by: Lonni Necessary MD 05/20/2024 05:52 PM EDT RP Workstation: [id number]   MR BRAIN WO CONTRAST Result Date: 05/20/2024 EXAM: MRI BRAIN WITHOUT CONTRAST 05/20/2024 05:19:12 PM TECHNIQUE: Multiplanar multisequence MRI of the head/brain was performed without the administration of intravenous contrast. COMPARISON: None available. CLINICAL HISTORY: Mental status change, unknown cause. FINDINGS: BRAIN AND VENTRICLES: An acute/subacute nonhemorrhagic 12 mm infarct is present in the lateral right thalamus. Scattered subcortical T2 hyperintensities bilaterally are moderately advanced for age. This most likely reflects the sequelae of chronic microvascular ischemia. No intracranial hemorrhage. No mass. No midline shift. No hydrocephalus. The sella is unremarkable. Normal flow voids. ORBITS: No acute abnormality. SINUSES AND MASTOIDS: A polyp or mixed retention cyst is present in the left maxillary sinus. BONES AND SOFT TISSUES: Normal marrow  signal. No acute soft tissue abnormality. IMPRESSION: 1. Acute/subacute nonhemorrhagic 12 mm infarct in the lateral right thalamus. 2. Moderately advanced scattered subcortical T2 hyperintensities bilaterally, likely sequelae of chronic microvascular ischemia. Electronically signed by: Lonni Necessary MD 05/20/2024 05:48 PM EDT RP Workstation: [id number]   ECHOCARDIOGRAM COMPLETE Result Date: 05/20/2024    ECHOCARDIOGRAM REPORT   Patient Name:   CYTHNIA OSMUN Date of Exam: 05/20/2024 Medical Rec #:  [id number]    Height:       62.0 in Accession #:    [id number]   Weight:       162.9 lb Date of Birth:  [birth year]     BSA:          1.752 m Patient Age:    53 years     BP:           100/79 mmHg Patient Gender:  F            HR:           90 bpm. Exam Location:  Inpatient Procedure: 2D Echo, Color Doppler, Cardiac Doppler, 3D Echo and Intracardiac            Opacification Agent (Both Spectral and Color Flow Doppler were            utilized during procedure). Indications:    NSTEMI  History:        Patient has prior history of Echocardiogram examinations, most                 recent 06/18/2021. Risk Factors:Hypertension and Dyslipidemia.  Sonographer:    Logan Shove RDCS Referring Phys: [id number] JONATHAN SEGARS IMPRESSIONS  1. Left ventricular ejection fraction, by estimation, is 35 to 40%. Left ventricular ejection fraction by 3D volume is 40 %. The left ventricle has moderately decreased function. The left ventricle demonstrates regional wall motion abnormalities (see scoring diagram/findings for description). Left ventricular diastolic parameters are consistent with Grade I diastolic dysfunction (impaired relaxation).  2. Right ventricular systolic function is normal. The right ventricular size is normal.  3. The mitral valve is normal in structure. Mild mitral valve regurgitation. No evidence of mitral stenosis.  4. The aortic valve is tricuspid. Aortic valve regurgitation is not visualized. No aortic stenosis is present.  5. The inferior vena cava is normal in size with greater than 50% respiratory variability, suggesting right atrial pressure of 3 mmHg. FINDINGS  Left Ventricle: Left ventricular ejection fraction, by estimation, is 35 to 40%. Left ventricular ejection fraction by 3D volume is 40 %. The left ventricle has moderately decreased function. The left ventricle demonstrates regional wall motion abnormalities. Definity contrast agent was given IV to delineate the left ventricular endocardial borders. The left ventricular internal cavity size was normal in size. There is no left ventricular hypertrophy. Left ventricular diastolic parameters are consistent with Grade I diastolic dysfunction (impaired relaxation).   LV Wall Scoring: The anterior septum is akinetic. Right Ventricle: The right ventricular size is normal. No increase in right ventricular wall thickness. Right ventricular systolic function is normal. Left Atrium: Left atrial size was normal in size. Right Atrium: Right atrial size was normal in size. Pericardium: There is no evidence of pericardial effusion. Mitral Valve: The mitral valve is normal in structure. Mild mitral valve regurgitation. No evidence of mitral valve stenosis. Tricuspid Valve: The tricuspid valve is normal in structure. Tricuspid valve regurgitation is not demonstrated. No evidence of tricuspid stenosis. Aortic Valve: The aortic valve is tricuspid. Aortic valve regurgitation is not  visualized. No aortic stenosis is present. Aortic valve peak gradient measures 3.8 mmHg. Pulmonic Valve: The pulmonic valve was normal in structure. Pulmonic valve regurgitation is not visualized. No evidence of pulmonic stenosis. Aorta: The aortic root is normal in size and structure. Venous: The inferior vena cava is normal in size with greater than 50% respiratory variability, suggesting right atrial pressure of 3 mmHg. IAS/Shunts: No atrial level shunt detected by color flow Doppler.  LEFT VENTRICLE PLAX 2D LVIDd:         4.50 cm         Diastology LVIDs:         3.50 cm         LV e' medial:    4.57 cm/s LV PW:         1.00 cm         LV E/e' medial:  10.4 LV IVS:        0.80 cm         LV e' lateral:   6.85 cm/s LVOT diam:     2.00 cm         LV E/e' lateral: 7.0 LVOT Area:     3.14 cm                                 3D Volume EF LV Volumes (MOD)               LV 3D EF:    Left LV vol d, MOD    91.2 ml                    ventricul A2C:                                        ar LV vol d, MOD    98.8 ml                    ejection A4C:                                        fraction LV vol s, MOD    54.3 ml                    by 3D A2C:                                        volume is LV vol s, MOD    58.9 ml                     40 %. A4C: LV SV MOD A2C:   36.9 ml LV SV MOD A4C:   98.8 ml       3D Volume EF: LV SV MOD BP:    38.3 ml       3D EF:        40 %                                LV EDV:       125  ml                                LV ESV:       74 ml                                LV SV:        50 ml RIGHT VENTRICLE RV Basal diam:  2.50 cm RV S prime:     10.40 cm/s TAPSE (M-mode): 1.2 cm LEFT ATRIUM           Index        RIGHT ATRIUM          Index LA diam:      3.30 cm 1.88 cm/m   RA Area:     9.43 cm LA Vol (A2C): 45.8 ml 26.14 ml/m  RA Volume:   18.00 ml 10.27 ml/m LA Vol (A4C): 18.0 ml 10.27 ml/m  AORTIC VALVE AV Area (Vmax): 2.63 cm AV Vmax:        97.00 cm/s AV Peak Grad:   3.8 mmHg LVOT Vmax:      81.20 cm/s  AORTA Ao Root diam: 2.80 cm Ao Asc diam:  2.60 cm MITRAL VALVE MV Area (PHT): 4.39 cm    SHUNTS MV Decel Time: 173 msec    Systemic Diam: 2.00 cm MV E velocity: 47.70 cm/s MV A velocity: 99.60 cm/s MV E/A ratio:  0.48 Oneil Parchment MD Electronically signed by Oneil Parchment MD Signature Date/Time: 05/20/2024/2:52:25 PM    Final    CT HEAD WO CONTRAST ( ) Result Date: 05/20/2024 EXAM: CT HEAD WITHOUT CONTRAST 05/20/2024 11:37:56 AM TECHNIQUE: CT of the head was performed without the administration of intravenous contrast. Automated exposure control, iterative reconstruction, and/or weight based adjustment of the mA/kV was utilized to reduce the radiation dose to as low as reasonably achievable. COMPARISON: None available. CLINICAL HISTORY: Neuro deficit, acute, stroke suspected. NSTEMI yesterday. PTCA to the LAD yesterday. FINDINGS: BRAIN AND VENTRICLES: No acute hemorrhage. No evidence of acute infarct. No hydrocephalus. No extra-axial collection. No mass effect or midline shift. ORBITS: No acute abnormality. SINUSES: No acute abnormality. SOFT TISSUES AND SKULL: No acute soft tissue abnormality. No skull fracture. IMPRESSION: 1. No acute intracranial abnormality. Electronically signed by:  Lonni Necessary MD 05/20/2024 11:51 AM EDT RP Workstation: [id number]   CARDIAC CATHETERIZATION Addendum Date: 05/19/2024 Coronary intervention 05/19/2024: LM: Normal LAD: Mid focal 99% stenosis          Diffuse 50 to 70% disease in mid LAD, distal apical focal 80% stenosis          Small caliber bifurcating diagonal 1 with ostial 80% stenosis Lcx: Large dominant vessel with proximal 20%, OM1 30% disease RCA: Small nondominant vessel, no significant disease LVEDP 19 mmHg Successful percutaneous coronary intervention mid LAD        PTCA and stent placement 3.5 X 20 mm & 3.0 X 20 mm Synergy drug-eluting stents        Post dilatation using 3.0 X 20 and 3.5 X 12 mm [place] balloons up to 20 atm        IVUS with excellent stent expansion and apposition, no stent edge dissection        Recommend medical management for rest of the disease Newman JINNY Lawrence, MD   Result Date: 05/19/2024 Images from the original result were not  included. Coronary intervention 05/19/2024: LM: Normal LAD: Mid focal 99% stenosis          Diffuse 50 to 70% disease in mid LAD, distal apical focal 80% stenosis          Small caliber bifurcating diagonal 1 with ostial 80% stenosis Lcx: Large dominant vessel with proximal 20%, OM1 30% disease RCA: Small nondominant vessel, no significant disease LVEDP 19 mmHg Successful percutaneous coronary intervention mid LAD        PTCA and stent placement 3.5 X 20 mm & 3.0 X 20 mm Synergy drug-eluting stents        Post dilatation using 3.0 X 20 and 3.5 X 12 mm [place] balloons up to 20 atm        IVUS with excellent stent expansion and apposition, no stent edge dissection        Recommend medical management for rest of the disease Newman JINNY Lawrence, MD   CT Angio Chest PE W and/or Wo Contrast Result Date: 05/18/2024 CLINICAL DATA:  Pulmonary embolus suspected with high probability. Persistent upper mid chest pain. Pain and burning sensation. EXAM: CT ANGIOGRAPHY CHEST WITH CONTRAST TECHNIQUE: Multidetector  CT imaging of the chest was performed using the standard protocol during bolus administration of intravenous contrast. Multiplanar CT image reconstructions and MIPs were obtained to evaluate the vascular anatomy. RADIATION DOSE REDUCTION: This exam was performed according to the departmental dose-optimization program which includes automated exposure control, adjustment of the mA and/or kV according to patient size and/or use of iterative reconstruction technique. CONTRAST:  75mL OMNIPAQUE  IOHEXOL  350 MG/ML SOLN COMPARISON:  Chest radiograph 05/18/2024 FINDINGS: Cardiovascular: Technically adequate study with good opacification of the central and segmental pulmonary arteries. Mild motion artifact. No focal filling defects are demonstrated. No evidence of significant pulmonary embolus. Normal caliber thoracic aorta. Scattered aortic and coronary artery calcification. No aneurysm or dissection. Heart size is normal. No pericardial effusions. Mediastinum/Nodes: No enlarged mediastinal, hilar, or axillary lymph nodes. Thyroid  gland, trachea, and esophagus demonstrate no significant findings. Lungs/Pleura: Patchy airspace changes in the lung bases may represent dependent atelectasis or edema. Multifocal pneumonia less likely based on appearance. No pleural effusion or pneumothorax. Upper Abdomen: Diffuse fatty infiltration of the liver. Cholelithiasis with large stone in the gallbladder. No acute abnormalities. Musculoskeletal: No chest wall abnormality. No acute or significant osseous findings. Review of the MIP images confirms the above findings. IMPRESSION: 1. No evidence of significant pulmonary embolus. 2. Hazy airspace disease in the lung bases likely represent dependent atelectasis or edema. 3. Fatty infiltration of the liver. 4. Cholelithiasis without evidence of acute cholecystitis. Electronically Signed   By: Elsie Gravely M.D.   On: 05/18/2024 21:10   DG Chest 2 View Result Date: 05/18/2024 CLINICAL  DATA:  chest pain EXAM: CHEST - 2 VIEW COMPARISON:  05/14/2021 FINDINGS: No focal airspace consolidation, pleural effusion, or pneumothorax. No cardiomegaly.No acute fracture or destructive lesion. IMPRESSION: No acute cardiopulmonary abnormality. Electronically Signed   By: Rogelia Myers M.D.   On: 05/18/2024 17:48    Microbiology: Results for orders placed or performed during the hospital encounter of 05/18/24  MRSA Next Gen by PCR, Nasal     Status: None   Collection Time: 05/19/24  5:47 PM   Specimen: Nasal Mucosa; Nasal Swab  Result Value Ref Range Status   MRSA by PCR Next Gen NOT DETECTED NOT DETECTED Final    Comment: (NOTE) The GeneXpert MRSA Assay (FDA approved for NASAL specimens only), is one component of a  comprehensive MRSA colonization surveillance program. It is not intended to diagnose MRSA infection nor to guide or monitor treatment for MRSA infections. Test performance is not FDA approved in patients less than 65 years old. Performed at [facility], [address] [place], [place] [address]     Labs: CBC: Recent Labs  Lab 05/18/24 1805 05/19/24 0541 05/20/24 0328 05/21/24 0706  WBC 7.5 8.7 10.4 7.8  NEUTROABS 3.9  --   --   --   HGB 14.5 12.7 13.0 13.3  HCT 41.9 36.1 37.0 37.9  MCV 95.9 93.5 91.6 92.2  PLT 237 201 217 207   Basic Metabolic Panel: Recent Labs  Lab 05/18/24 1805 05/18/24 2221 05/19/24 0541 05/20/24 0328  NA 140  --  141 138  K 3.8  --  3.7 3.7  CL 104  --  112* 106  CO2 22  --  21* 18*  GLUCOSE 148*  --  138* 127*  BUN 12  --  10 8  CREATININE 0.77  --  0.71 0.83  CALCIUM  9.1  --  8.5* 9.0  MG  --  2.1  --   --   PHOS  --  2.9  --   --    Liver Function Tests: Recent Labs  Lab 05/18/24 1805  AST 42*  ALT 45*  ALKPHOS 74  BILITOT 0.8  PROT 7.3  ALBUMIN 4.3   CBG: Recent Labs  Lab 05/21/24 1702 05/21/24 2107 05/22/24 0634 05/22/24 1233 05/22/24 1341  GLUCAP 113* 140* 114* 129* 132*    Discharge time  spent: 37 minutes.  Signed: Concepcion Riser, MD [facility] 05/22/2024

## 2024-05-22 NOTE — Plan of Care (Signed)
 Problem: Health Behavior/Discharge Planning: Goal: Ability to manage health-related needs will improve 05/22/2024 1501 by Morna Aquas, RN Outcome: Adequate for Discharge 05/22/2024 0901 by Morna Aquas, RN Outcome: Progressing   Problem: Clinical Measurements: Goal: Ability to maintain clinical measurements within normal limits will improve Outcome: Adequate for Discharge Goal: Will remain free from infection Outcome: Adequate for Discharge Goal: Diagnostic test results will improve Outcome: Adequate for Discharge Goal: Respiratory complications will improve Outcome: Adequate for Discharge Goal: Cardiovascular complication will be avoided Outcome: Adequate for Discharge   Problem: Activity: Goal: Risk for activity intolerance will decrease 05/22/2024 1501 by Morna Aquas, RN Outcome: Adequate for Discharge 05/22/2024 0901 by Morna Aquas, RN Outcome: Progressing   Problem: Nutrition: Goal: Adequate nutrition will be maintained Outcome: Adequate for Discharge   Problem: Coping: Goal: Level of anxiety will decrease 05/22/2024 1501 by Morna Aquas, RN Outcome: Adequate for Discharge 05/22/2024 0901 by Morna Aquas, RN Outcome: Progressing   Problem: Elimination: Goal: Will not experience complications related to bowel motility Outcome: Adequate for Discharge Goal: Will not experience complications related to urinary retention Outcome: Adequate for Discharge   Problem: Pain Managment: Goal: General experience of comfort will improve and/or be controlled Outcome: Adequate for Discharge   Problem: Safety: Goal: Ability to remain free from injury will improve 05/22/2024 1501 by Morna Aquas, RN Outcome: Adequate for Discharge 05/22/2024 0901 by Morna Aquas, RN Outcome: Progressing   Problem: Skin Integrity: Goal: Risk for impaired skin integrity will decrease Outcome: Adequate for Discharge   Problem: Education: Goal: Understanding of CV  disease, CV risk reduction, and recovery process will improve Outcome: Adequate for Discharge Goal: Individualized Educational Video(s) Outcome: Adequate for Discharge   Problem: Activity: Goal: Ability to return to baseline activity level will improve Outcome: Adequate for Discharge   Problem: Cardiovascular: Goal: Ability to achieve and maintain adequate cardiovascular perfusion will improve 05/22/2024 1501 by Morna Aquas, RN Outcome: Adequate for Discharge 05/22/2024 0901 by Morna Aquas, RN Outcome: Progressing Goal: Vascular access site(s) Level 0-1 will be maintained 05/22/2024 1501 by Morna Aquas, RN Outcome: Adequate for Discharge 05/22/2024 0901 by Morna Aquas, RN Outcome: Progressing   Problem: Health Behavior/Discharge Planning: Goal: Ability to safely manage health-related needs after discharge will improve Outcome: Adequate for Discharge   Problem: Education: Goal: Ability to describe self-care measures that may prevent or decrease complications (Diabetes Survival Skills Education) will improve Outcome: Adequate for Discharge Goal: Individualized Educational Video(s) Outcome: Adequate for Discharge   Problem: Coping: Goal: Ability to adjust to condition or change in health will improve Outcome: Adequate for Discharge   Problem: Fluid Volume: Goal: Ability to maintain a balanced intake and output will improve Outcome: Adequate for Discharge   Problem: Health Behavior/Discharge Planning: Goal: Ability to identify and utilize available resources and services will improve Outcome: Adequate for Discharge Goal: Ability to manage health-related needs will improve Outcome: Adequate for Discharge   Problem: Metabolic: Goal: Ability to maintain appropriate glucose levels will improve Outcome: Adequate for Discharge   Problem: Nutritional: Goal: Maintenance of adequate nutrition will improve Outcome: Adequate for Discharge Goal: Progress toward  achieving an optimal weight will improve Outcome: Adequate for Discharge   Problem: Skin Integrity: Goal: Risk for impaired skin integrity will decrease Outcome: Adequate for Discharge   Problem: Tissue Perfusion: Goal: Adequacy of tissue perfusion will improve Outcome: Adequate for Discharge   Problem: Education: Goal: Knowledge of disease or condition will improve Outcome: Adequate for Discharge Goal: Knowledge of secondary prevention will improve (MUST DOCUMENT ALL) Outcome:  Adequate for Discharge Goal: Knowledge of patient specific risk factors will improve (DELETE if not current risk factor) Outcome: Adequate for Discharge   Problem: Ischemic Stroke/TIA Tissue Perfusion: Goal: Complications of ischemic stroke/TIA will be minimized 05/22/2024 1501 by Morna Aquas, RN Outcome: Adequate for Discharge 05/22/2024 0901 by Morna Aquas, RN Outcome: Progressing   Problem: Coping: Goal: Will verbalize positive feelings about self Outcome: Adequate for Discharge Goal: Will identify appropriate support needs Outcome: Adequate for Discharge   Problem: Health Behavior/Discharge Planning: Goal: Ability to manage health-related needs will improve Outcome: Adequate for Discharge Goal: Goals will be collaboratively established with patient/family Outcome: Adequate for Discharge   Problem: Self-Care: Goal: Ability to participate in self-care as condition permits will improve Outcome: Adequate for Discharge Goal: Verbalization of feelings and concerns over difficulty with self-care will improve Outcome: Adequate for Discharge Goal: Ability to communicate needs accurately will improve Outcome: Adequate for Discharge   Problem: Nutrition: Goal: Risk of aspiration will decrease Outcome: Adequate for Discharge Goal: Dietary intake will improve Outcome: Adequate for Discharge

## 2024-05-23 DIAGNOSIS — I255 Ischemic cardiomyopathy: Secondary | ICD-10-CM | POA: Insufficient documentation

## 2024-05-29 ENCOUNTER — Ambulatory Visit: Attending: Cardiology | Admitting: Cardiology

## 2024-05-29 ENCOUNTER — Telehealth (HOSPITAL_COMMUNITY): Payer: Self-pay

## 2024-05-29 ENCOUNTER — Encounter: Payer: Self-pay | Admitting: Cardiology

## 2024-05-29 VITALS — BP 122/82 | HR 85 | Ht 62.0 in | Wt 158.6 lb

## 2024-05-29 DIAGNOSIS — I25119 Atherosclerotic heart disease of native coronary artery with unspecified angina pectoris: Secondary | ICD-10-CM

## 2024-05-29 DIAGNOSIS — I214 Non-ST elevation (NSTEMI) myocardial infarction: Secondary | ICD-10-CM | POA: Diagnosis not present

## 2024-05-29 DIAGNOSIS — I5020 Unspecified systolic (congestive) heart failure: Secondary | ICD-10-CM

## 2024-05-29 DIAGNOSIS — E782 Mixed hyperlipidemia: Secondary | ICD-10-CM

## 2024-05-29 DIAGNOSIS — Z8673 Personal history of transient ischemic attack (TIA), and cerebral infarction without residual deficits: Secondary | ICD-10-CM

## 2024-05-29 NOTE — Telephone Encounter (Signed)
 Faxed outside referral for Phase II Cardiac Rehab to [facility].

## 2024-05-29 NOTE — Patient Instructions (Addendum)
Medication Instructions:  Your physician recommends that you continue on your current medications as directed. Please refer to the Current Medication list given to you today.   Labwork: none  Testing/Procedures: none  Follow-Up:  Your physician recommends that you schedule a follow-up appointment in: 1 month  Any Other Special Instructions Will Be Listed Below (If Applicable).  If you need a refill on your cardiac medications before your next appointment, please call your pharmacy.  

## 2024-05-29 NOTE — Progress Notes (Signed)
 Cardiology Office Note  Date: 05/29/2024   ID: Ashlee Brown, DOB Jan 14, 1971, MRN [id number]  History of Present Illness: Ashlee Brown is a 53 y.o. female last seen in August.  She is here today with her husband for a posthospital visit.  I reviewed the chart.  She was admitted for evaluation of worsening chest discomfort, high-sensitivity troponin I level increased to 2601.  She was treated for NSTEMI and underwent cardiac catheterization by Dr. Elmira with finding of significant mid LAD stenosis managed with DES x 2, otherwise mild nonobstructive disease.  LVEF 35 to 40% by echocardiogram.  Hospital course also complicated by acute thalamic stroke evaluated by neurology and imaged by brain MRI.  She was incidentally noted to have cervical disc disease at C4-C5 with plan to follow-up as an outpatient with neurosurgery.  No atrial arrhythmias documented with plan to continue dual antiplatelet therapy.  She states that she has made progress over the last week.  Has been working with outpatient PT/OT.  Has better sense steadiness when she walks, better control of her hands.  Has not been sleeping well, no headaches.  No chest pain or unusual shortness of breath.  Her husband has been checking her vital signs regularly.  We went over her medications, she reports compliance with therapy.  I discussed her cardiac testing from hospital stay.  Physical Exam: VS:  BP 122/82 (BP Location: Right Arm)   Pulse 85   Ht 5' 2 (1.575 m)   Wt 158 lb 9.6 oz (71.9 kg)   SpO2 96%   BMI 29.01 kg/m , BMI Body mass index is 29.01 kg/m.  Wt Readings from Last 3 Encounters:  05/29/24 158 lb 9.6 oz (71.9 kg)  05/18/24 162 lb 14.7 oz (73.9 kg)  04/20/24 162 lb 14.7 oz (73.9 kg)    General: Patient appears comfortable at rest. HEENT: Conjunctiva and lids normal. Neck: Supple, no elevated JVP or carotid bruits. Lungs: Clear to auscultation, nonlabored breathing at rest. Cardiac: Regular rate and rhythm, no  S3 or significant systolic murmur. Extremities: No pitting edema.  ECG:  An ECG dated 05/21/2024 was personally reviewed today and demonstrated:  Sinus tachycardia with nonspecific ST-T wave abnormalities, Q-wave in lead III.  Labwork: 05/18/2024: ALT 45; AST 42; B Natriuretic Peptide 74.0; Magnesium  2.1; TSH 1.955 05/20/2024: BUN 8; Creatinine, Ser 0.83; Potassium 3.7; Sodium 138 05/21/2024: Hemoglobin 13.3; Platelets 207     Component Value Date/Time   CHOL 132 05/20/2024 0328   TRIG 146 05/20/2024 0328   HDL 31 (L) 05/20/2024 0328   CHOLHDL 4.3 05/20/2024 0328   VLDL 29 05/20/2024 0328   LDLCALC 72 05/20/2024 0328   LDLCALC 66 04/13/2024 0905   Other Studies Reviewed Today:  Cardiac catheterization 05/19/2024: Coronary intervention 05/19/2024: LM: Normal LAD: Mid focal 99% stenosis          Diffuse 50 to 70% disease in mid LAD, distal apical focal 80% stenosis          Small caliber bifurcating diagonal 1 with ostial 80% stenosis Lcx: Large dominant vessel with proximal 20%, OM1 30% disease RCA: Small nondominant vessel, no significant disease   LVEDP 19 mmHg   Successful percutaneous coronary intervention mid LAD        PTCA and stent placement 3.5 X 20 mm & 3.0 X 20 mm Synergy drug-eluting stents        Post dilatation using 3.0 X 20 and 3.5 X 12 mm Minerva balloons up to 20 atm  IVUS with excellent stent expansion and apposition, no stent edge dissection        Recommend medical management for rest of the disease  Echocardiogram 05/20/2024:  1. Left ventricular ejection fraction, by estimation, is 35 to 40%. Left  ventricular ejection fraction by 3D volume is 40 %. The left ventricle has  moderately decreased function. The left ventricle demonstrates regional  wall motion abnormalities (see  scoring diagram/findings for description). Left ventricular diastolic  parameters are consistent with Grade I diastolic dysfunction (impaired  relaxation).   2. Right ventricular  systolic function is normal. The right ventricular  size is normal.   3. The mitral valve is normal in structure. Mild mitral valve  regurgitation. No evidence of mitral stenosis.   4. The aortic valve is tricuspid. Aortic valve regurgitation is not  visualized. No aortic stenosis is present.   5. The inferior vena cava is normal in size with greater than 50%  respiratory variability, suggesting right atrial pressure of 3 mmHg.   Assessment and Plan:  1.  CAD status post NSTEMI with DES x 2 to the mid LAD in September.  She reports no active angina at this time with anticipated 15-month course of aspirin  81 mg daily and Brilinta  90 mg twice daily.  2.  HFrEF with ischemic cardiomyopathy, potentially myocardial stunning with recent ACS and LVEF 35 to 40%.  GDMT now includes Farxiga 10 mg daily, Cozaar 25 mg daily, Toprol -XL 25 mg daily, and Aldactone  12.5 mg daily.  No adjustments made today given review of recent blood pressures.  Plan on repeat echocardiogram in 3 months.  3.  Recent acute thalamic stroke, clinically improving and undergoing outpatient PT/OT.  Neurology follow-up pending.  Current antiplatelet regimen includes aspirin  81 mg daily and Brilinta  90 mg twice daily in the setting of recent ACS with stent intervention.  No atrial arrhythmias documented during hospitalization.  She is also on statin therapy.  4.  Mixed hyperlipidemia, LDL 60s to 70s and now on high-dose Crestor  40 mg daily.  5.  Left upper lobe segmental and subsegmental pulmonary embolus diagnosed in April with no evidence of RV strain.  She completed a 59-month course of Eliquis  with follow-up per hematology and no plan for chronic anticoagulation.  Disposition:  Follow up 1 month.  Signed, Jayson JUDITHANN Sierras, M.D., F.A.C.C. [facility]

## 2024-06-01 ENCOUNTER — Telehealth: Payer: Self-pay | Admitting: Pharmacist

## 2024-06-01 ENCOUNTER — Encounter: Payer: Self-pay | Admitting: Cardiology

## 2024-06-01 NOTE — Telephone Encounter (Signed)
 Patient following back up. I reached out to [facility] but no response, was going to advise patient that it would be best if she could come into the office for this appt but phone disconnected and when I tried to call back the call was going to straight to VM.

## 2024-06-01 NOTE — Telephone Encounter (Signed)
 Patient wants to know if she can have a tele-visit for tomorrow's appointment.  Patient stated she lives 40 miles away.

## 2024-06-02 ENCOUNTER — Encounter: Payer: Self-pay | Admitting: Pharmacist

## 2024-06-02 ENCOUNTER — Ambulatory Visit: Admitting: Pharmacist

## 2024-06-02 DIAGNOSIS — E7849 Other hyperlipidemia: Secondary | ICD-10-CM | POA: Diagnosis not present

## 2024-06-02 DIAGNOSIS — I10 Essential (primary) hypertension: Secondary | ICD-10-CM

## 2024-06-02 DIAGNOSIS — E785 Hyperlipidemia, unspecified: Secondary | ICD-10-CM | POA: Insufficient documentation

## 2024-06-02 NOTE — Progress Notes (Signed)
 Patient ID: Ashlee Brown                 DOB: 10-18-1970                    MRN: [id number]      HPI: Ashlee Brown is a 53 y.o. female patient referred to lipid clinic by Dr.Patwardhan. PMH is significant for  CAD, recent NSTEMI mid LAD stenosis managed with DES x 2, otherwise mild nonobstructive disease. LVEF 35 to 40% by echocardiogram, Hospital course also complicated by acute thalamic stroke evaluated by neurology and imaged by brain MRI, HLD, T2DM.  The patient presented today accompanied by her husband. She reports tolerating Crestor  (rosuvastatin ) well but has been experiencing fatigue and symptoms consistent with orthostatic hypotension. No falls have occurred. She was advised to take her time when changing positions to minimize symptoms. Her home blood pressure averages around 110/75 mmHg, with a heart rate of 60 bpm. She was previously on pravastatin  prior to her myocardial infarction and tolerated it well. We reviewed her cardiovascular risk factors and discussed LDL-C treatment goals. Various options for further LDL-C reduction were discussed, including ezetimibe, PCSK9 inhibitors, bempedoic acid, and inclisiran. The conversation included a review of each medication's mechanism of action, dosing schedule, potential side effects, and expected LDL-C lowering efficacy. We also reviewed cost considerations and explored potential patient assistance programs to support access to these therapies if needed.  Current Medications: rosuvastatin  40 mg daily  Intolerances: pravastatin  an 40 gm and Zetia 10 mg - insufficient response  Risk Factors: CAD, premature MI,T2DM, acute thalamic stroke LDL goal: <55 mg/dl  Last lab: 90/72/74 TC 132, TG 146, HDLc 31, LDLc 72  Lpa 34 nmol/L ( WNL)   Diet: appetite is small    Exercise: goes to PT/OT 2 times per week   Family History:  Relation Problem Comments  Mother Metallurgist) Hyperlipidemia   Hypertension     Father (Deceased)   Brother Metallurgist)   Son  Metallurgist)     Social History:  Alcohol: none  Smoking: none  Labs:  Lipid Panel     Component Value Date/Time   CHOL 132 05/20/2024 0328   TRIG 146 05/20/2024 0328   HDL 31 (L) 05/20/2024 0328   CHOLHDL 4.3 05/20/2024 0328   VLDL 29 05/20/2024 0328   LDLCALC 72 05/20/2024 0328   LDLCALC 66 04/13/2024 0905    Past Medical History:  Diagnosis Date   Hyperlipidemia    Hypertension     Current Outpatient Medications on File Prior to Visit  Medication Sig Dispense Refill   Accu-Chek Softclix Lancets lancets Use in the morning, at noon, and at bedtime. 100 each 0   aspirin  81 MG chewable tablet Chew 1 tablet (81 mg total) by mouth daily. 30 tablet 6   Blood Glucose Monitoring Suppl (BLOOD GLUCOSE MONITOR SYSTEM) w/Device KIT Use in the morning, at noon, and at bedtime. 1 kit 0   dapagliflozin propanediol (FARXIGA) 10 MG TABS tablet Take 1 tablet (10 mg total) by mouth daily before breakfast. 30 tablet 2   gabapentin (NEURONTIN) 300 MG capsule Take 1 capsule (300 mg total) by mouth at bedtime. 30 capsule 1   Glucose Blood (BLOOD GLUCOSE TEST STRIPS) STRP Use to check blood sugar in the morning, at noon, and at bedtime. 100 strip 0   Lancet Device MISC Use in the morning, at noon, and at bedtime. 1 each 0   losartan (COZAAR) 25 MG tablet Take 1 tablet (  25 mg total) by mouth daily. 30 tablet 2   metFORMIN (GLUCOPHAGE) 500 MG tablet Take 1 tablet (500 mg total) by mouth 2 (two) times daily with a meal. 60 tablet 2   metoprolol  succinate (TOPROL -XL) 25 MG 24 hr tablet TAKE ONE TABLET BY MOUTH ONCE DAILY 90 tablet 3   rosuvastatin  (CRESTOR ) 40 MG tablet Take 1 tablet (40 mg total) by mouth daily. 90 tablet 3   spironolactone  (ALDACTONE ) 25 MG tablet Take 0.5 tablets (12.5 mg total) by mouth daily.     ticagrelor  (BRILINTA ) 90 MG TABS tablet Take 1 tablet (90 mg total) by mouth 2 (two) times daily. 60 tablet 6   No current facility-administered medications on file prior to visit.     Allergies  Allergen Reactions   Sulfamethoxazole Other (See Comments)    Blood clots   Trimethoprim Other (See Comments)    Blood clots   Escitalopram Oxalate Other (See Comments)    Unknown    Lisinopril Other (See Comments)    Unknown    Naltrexone-Bupropion Hcl Er Other (See Comments)    Unknown     Assessment/Plan:  1. Hyperlipidemia -  Problem  Hyperlipidemia   Current Medications: rosuvastatin  40 mg daily  Intolerances: pravastatin  an 40 gm and Zetia 10 mg - insufficient response  Risk Factors: CAD, premature MI,T2DM, acute thalamic stroke LDL goal: <55 mg/dl  Last lab: 90/72/74 TC 132, TG 146, HDLc 31, LDLc 72  while on pravastatin  40 mg daily     Hyperlipidemia Assessment and plan  Patient was previously on pravastatin  40 mg, with LDL-C <70 mg/dL. Following a recent myocardial infarction and thalamic stroke, LDL-C goal has been adjusted to <55 mg/dL. Initiated on high-intensity statin (Crestor  40 mg) post-MI, which she is tolerating well. Plan to repeat lipid panel in 2 months to assess response to Crestor . If LDL-C remains above goal, will consider adding:Ezetimibe (Zetia),PCSK9 inhibitor or inclisiran Follow-up labs due mid-November 2025.     Thank you,  Robbi Blanch, Pharm.D [place] [facility]. [facility] [address], [place], [place] [address] Phone: [phone]; Fax: [phone]

## 2024-06-02 NOTE — Telephone Encounter (Signed)
 Patient came to her apt

## 2024-06-02 NOTE — Assessment & Plan Note (Signed)
 Assessment and plan  Patient was previously on pravastatin  40 mg, with LDL-C <70 mg/dL. Following a recent myocardial infarction and thalamic stroke, LDL-C goal has been adjusted to <55 mg/dL. Initiated on high-intensity statin (Crestor  40 mg) post-MI, which she is tolerating well. Plan to repeat lipid panel in 2 months to assess response to Crestor . If LDL-C remains above goal, will consider adding:Ezetimibe (Zetia),PCSK9 inhibitor or inclisiran Follow-up labs due mid-November 2025.

## 2024-06-07 ENCOUNTER — Encounter: Payer: Self-pay | Admitting: Cardiology

## 2024-06-07 ENCOUNTER — Other Ambulatory Visit: Payer: Self-pay

## 2024-06-07 MED ORDER — LOSARTAN POTASSIUM 25 MG PO TABS: 12.5000 mg | ORAL_TABLET | Freq: Every day | ORAL | 3 refills | Status: AC

## 2024-06-12 ENCOUNTER — Encounter: Payer: Self-pay | Admitting: Cardiology

## 2024-06-12 DIAGNOSIS — I10 Essential (primary) hypertension: Secondary | ICD-10-CM

## 2024-06-12 DIAGNOSIS — Z79899 Other long term (current) drug therapy: Secondary | ICD-10-CM

## 2024-06-12 DIAGNOSIS — R42 Dizziness and giddiness: Secondary | ICD-10-CM

## 2024-06-21 ENCOUNTER — Telehealth: Payer: Self-pay

## 2024-06-21 NOTE — Telephone Encounter (Signed)
 Per pt referral was listed as screening for colonoscopy and GERD. PT was hospitalize for mild heart attach and mild stroke. Pt explained that she doesn't need to be seen for GERD. She only wants to be seen for colonoscopy but doesn't want to be scheduled for at least 4 months from now.  Pt states she will try to remember to call us  but would like for us  to reach out to her in about 4 months.   I closed the referral for now.

## 2024-06-26 NOTE — Telephone Encounter (Signed)
 Informed that BMET will be added to other lab work.

## 2024-06-26 NOTE — Telephone Encounter (Signed)
 Offered nurse visit for orthostatics and patient declined at this time. Reports she has an appointment on 07/07/2024 and will have orthostatic BP's done at that time.  Requesting to have electrolytes checked with FLP & LFT's, as potential causes of dizziness. Will forward to provider.

## 2024-06-28 ENCOUNTER — Ambulatory Visit: Payer: Self-pay | Admitting: Cardiology

## 2024-06-28 LAB — BASIC METABOLIC PANEL WITH GFR
BUN/Creatinine Ratio: 15 (ref 9–23)
BUN: 13 mg/dL (ref 6–24)
CO2: 19 mmol/L — ABNORMAL LOW (ref 20–29)
Calcium: 9.6 mg/dL (ref 8.7–10.2)
Chloride: 107 mmol/L — ABNORMAL HIGH (ref 96–106)
Creatinine, Ser: 0.88 mg/dL (ref 0.57–1.00)
Glucose: 155 mg/dL — ABNORMAL HIGH (ref 70–99)
Potassium: 4.4 mmol/L (ref 3.5–5.2)
Sodium: 141 mmol/L (ref 134–144)
eGFR: 79 mL/min/1.73 (ref >59)

## 2024-07-06 ENCOUNTER — Ambulatory Visit: Payer: Self-pay | Admitting: Pharmacist

## 2024-07-06 DIAGNOSIS — E7849 Other hyperlipidemia: Secondary | ICD-10-CM

## 2024-07-06 LAB — LIPID PANEL
Chol/HDL Ratio: 3.3 ratio (ref 0.0–4.4)
Cholesterol, Total: 93 mg/dL — ABNORMAL LOW (ref 100–199)
HDL: 28 mg/dL — ABNORMAL LOW (ref >39)
LDL Chol Calc (NIH): 40 mg/dL (ref 0–99)
Triglycerides: 145 mg/dL (ref 0–149)
VLDL Cholesterol Cal: 25 mg/dL (ref 5–40)

## 2024-07-06 LAB — HEPATIC FUNCTION PANEL
ALT: 45 IU/L — ABNORMAL HIGH (ref 0–32)
AST: 42 IU/L — ABNORMAL HIGH (ref 0–40)
Albumin: 4.8 g/dL (ref 3.8–4.9)
Alkaline Phosphatase: 81 IU/L (ref 49–135)
Bilirubin Total: 0.7 mg/dL (ref 0.0–1.2)
Bilirubin, Direct: 0.23 mg/dL (ref 0.00–0.40)
Total Protein: 6.9 g/dL (ref 6.0–8.5)

## 2024-07-06 NOTE — Telephone Encounter (Signed)
 Result discussed over the phone. Tolerates rosuvastatin  40 mg daily well - LDL at goal Liver enzymes are still slightly above the upper normal limit. Will repeat Liver function test in 3 months

## 2024-07-07 ENCOUNTER — Encounter: Payer: Self-pay | Admitting: Cardiology

## 2024-07-07 ENCOUNTER — Ambulatory Visit: Attending: Cardiology | Admitting: Cardiology

## 2024-07-07 VITALS — BP 122/83 | HR 89 | Ht 62.0 in | Wt 152.8 lb

## 2024-07-07 DIAGNOSIS — I25119 Atherosclerotic heart disease of native coronary artery with unspecified angina pectoris: Secondary | ICD-10-CM

## 2024-07-07 DIAGNOSIS — E782 Mixed hyperlipidemia: Secondary | ICD-10-CM

## 2024-07-07 DIAGNOSIS — Z8673 Personal history of transient ischemic attack (TIA), and cerebral infarction without residual deficits: Secondary | ICD-10-CM | POA: Diagnosis not present

## 2024-07-07 DIAGNOSIS — I5020 Unspecified systolic (congestive) heart failure: Secondary | ICD-10-CM | POA: Diagnosis not present

## 2024-07-07 NOTE — Patient Instructions (Signed)
 Medication Instructions:   STOP Spironolactone    Labwork: None today  Testing/Procedures: Echo in December  Follow-Up: 3 months  Any Other Special Instructions Will Be Listed Below (If Applicable).  If you need a refill on your cardiac medications before your next appointment, please call your pharmacy.

## 2024-07-07 NOTE — Progress Notes (Signed)
 Cardiology Office Note  Date: 07/07/2024   ID: Ashlee Brown, DOB Aug 20, 1971, MRN [id number]  History of Present Illness: Ashlee Brown is a 53 y.o. female last seen in October.  She is here today with her husband for a follow-up visit.  She is doing well with PT and plans to start with cardiac rehabilitation in December.  She does not report any angina, has stable NYHA class II dyspnea, no palpitations or syncope.  She still experiences orthostatic lightheadedness.  Orthostatic measurements were obtained today, negative by SBP drop (122-108) and positive by heart rate response (89-112).  We went over her medications.  Cozaar was cut back to 12.5 mg daily and ultimately discontinued due to orthostatic symptoms, however her blood pressure trend back up and she started back on Cozaar 12.5 mg daily.  We discussed stopping Aldactone  for the time being.  Otherwise no change in current regimen.  Follow-up lab work shows LDL down to 40.  Renal function has been stable.  She will be due for a follow-up echocardiogram next month.  Physical Exam: VS:  BP 122/83   Pulse 89   Ht 5' 2 (1.575 m)   Wt 152 lb 12.8 oz (69.3 kg)   SpO2 99%   BMI 27.95 kg/m , BMI Body mass index is 27.95 kg/m.  Wt Readings from Last 3 Encounters:  07/07/24 152 lb 12.8 oz (69.3 kg)  05/29/24 158 lb 9.6 oz (71.9 kg)  05/18/24 162 lb 14.7 oz (73.9 kg)    General: Patient appears comfortable at rest. HEENT: Conjunctiva and lids normal. Neck: Supple, no elevated JVP or carotid bruits. Lungs: Clear to auscultation, nonlabored breathing at rest. Cardiac: Regular rate and rhythm, no S3 or significant systolic murmur. Extremities: No pitting edema.  ECG:  An ECG dated 05/21/2024 was personally reviewed today and demonstrated:  Sinus tachycardia with nonspecific ST-T changes, Q-wave in lead III.  Labwork: 05/18/2024: B Natriuretic Peptide 74.0; Magnesium  2.1; TSH 1.955 05/21/2024: Hemoglobin 13.3; Platelets  207 06/27/2024: BUN 13; Creatinine, Ser 0.88; Potassium 4.4; Sodium 141 07/05/2024: ALT 45; AST 42     Component Value Date/Time   CHOL 93 (L) 07/05/2024 1040   TRIG 145 07/05/2024 1040   HDL 28 (L) 07/05/2024 1040   CHOLHDL 3.3 07/05/2024 1040   CHOLHDL 4.3 05/20/2024 0328   VLDL 29 05/20/2024 0328   LDLCALC 40 07/05/2024 1040   LDLCALC 66 04/13/2024 0905   Other Studies Reviewed Today:  No interval cardiac testing for review today.  Assessment and Plan:  1.  CAD status post NSTEMI with DES x 2 to the mid LAD in September.  She reports no angina and plans to start cardiac rehabilitation in December.  Continue aspirin  81 mg daily and Brilinta  90 mg twice daily.  She is also on statin therapy.   2.  HFrEF with ischemic cardiomyopathy, potentially myocardial stunning with ACS in September, LVEF 35 to 40%.  Given orthostatic symptoms we will stop Aldactone .  Try and continue Farxiga 10 mg daily, Cozaar 12.5 mg daily, and Toprol -XL 25 mg daily otherwise.  Update echocardiogram next month.   3.  Status post acute thalamic stroke in September.  She is on dual antiplatelet therapy as discussed above as well as Crestor  40 mg daily.   4.  Mixed hyperlipidemia, recent follow-up LDL 40.  Continue Crestor  40 mg daily.   5.  Left upper lobe segmental and subsegmental pulmonary embolus diagnosed in April with no evidence of RV strain.  She completed  a 21-month course of Eliquis  with follow-up per hematology and no plan for chronic anticoagulation.  Disposition:  Follow up 3 months.  Signed, Jayson JUDITHANN Sierras, M.D., F.A.C.C. [facility] at [facility]

## 2024-07-24 ENCOUNTER — Encounter: Payer: Self-pay | Admitting: Cardiology

## 2024-08-04 ENCOUNTER — Telehealth: Payer: Self-pay | Admitting: Cardiology

## 2024-08-04 NOTE — Telephone Encounter (Signed)
 Patient wants a call back regarding the out-of-pocket cost to have Echocardiogram test.

## 2024-08-08 ENCOUNTER — Telehealth: Payer: Self-pay | Admitting: Cardiology

## 2024-08-08 ENCOUNTER — Ambulatory Visit (HOSPITAL_COMMUNITY)

## 2024-08-08 NOTE — Telephone Encounter (Signed)
 Patient would like echo to be done at [facility]. Please advise.

## 2024-08-08 NOTE — Telephone Encounter (Signed)
 Pt requesting Echo order be changed to be done at another location. Please advise.

## 2024-08-09 NOTE — Telephone Encounter (Signed)
 Per billing these are the only two places she can have them done at   The following facilities are in network for the patient's policy, please advise if the patient would like to switch to any of these locations.  [facility] at [facility]: [phone] [address] [place], [place] [address]  Or  [facility]) [facility]: [phone] [address] [place], [place] [address]   I'm unsure of how to change the order to those places

## 2024-08-09 NOTE — Addendum Note (Signed)
 Addended by: KENETH ROSINA BROCKS on: 08/09/2024 09:45 AM   Modules accepted: Orders

## 2024-08-09 NOTE — Telephone Encounter (Signed)
 Spoke to patient who stated that she called [facility] who stated that she could have her Echo completed there and they accepted her insurance. Called [facility] for a fax number- was told they would call me back (registration) with a number.

## 2024-08-10 NOTE — Telephone Encounter (Signed)
 Spoke to Alieta at [employer] who provided fax number for the registration office. Alieta stated that schedulers would call patient to get echo scheduled with [facility]

## 2024-08-10 NOTE — Telephone Encounter (Signed)
 Patient notified via voicemail (DPR)

## 2024-09-04 ENCOUNTER — Encounter: Payer: Self-pay | Admitting: Pharmacist

## 2024-09-04 ENCOUNTER — Encounter: Payer: Self-pay | Admitting: Cardiology

## 2024-09-04 DIAGNOSIS — I2510 Atherosclerotic heart disease of native coronary artery without angina pectoris: Secondary | ICD-10-CM | POA: Insufficient documentation

## 2024-09-04 DIAGNOSIS — M5030 Other cervical disc degeneration, unspecified cervical region: Secondary | ICD-10-CM | POA: Insufficient documentation

## 2024-09-29 ENCOUNTER — Encounter: Payer: Self-pay | Admitting: Cardiology

## 2024-09-29 ENCOUNTER — Ambulatory Visit: Admitting: Cardiology

## 2024-09-29 VITALS — BP 126/74 | HR 84 | Ht 62.0 in | Wt 152.0 lb

## 2024-09-29 DIAGNOSIS — I25119 Atherosclerotic heart disease of native coronary artery with unspecified angina pectoris: Secondary | ICD-10-CM

## 2024-09-29 DIAGNOSIS — E782 Mixed hyperlipidemia: Secondary | ICD-10-CM

## 2024-09-29 DIAGNOSIS — I5022 Chronic systolic (congestive) heart failure: Secondary | ICD-10-CM

## 2024-09-29 NOTE — Patient Instructions (Signed)
 Medication Instructions:   Your physician recommends that you continue on your current medications as directed. Please refer to the Current Medication list given to you today.   Labwork: None today  Testing/Procedures: None today  Follow-Up: 6 months Dr.McDowell  Any Other Special Instructions Will Be Listed Below (If Applicable).  If you need a refill on your cardiac medications before your next appointment, please call your pharmacy.

## 2024-09-29 NOTE — Progress Notes (Signed)
 "    Cardiology Office Note  Date: 09/29/2024   ID: Ashlee Brown, DOB Aug 24, 1971, MRN [id number]  History of Present Illness: Ashlee Brown is a 54 y.o. female last seen in November 2025.  She is here for a follow-up visit.  Overall doing better, orthostatic lightheadedness has improved, she does not report any exertional chest pain or breathlessness beyond NYHA class II.  She is doing exercises at home.  We went over her medications today.  She reports compliance with current regimen and no obvious intolerances at this point.  She had a follow-up echocardiogram done at [facility] in January showing significant improvement in LVEF to the range of 45 to 50%.  Physical Exam: VS:  BP 126/74   Pulse 84   Ht 5' 2 (1.575 m)   Wt 152 lb (68.9 kg)   SpO2 97%   BMI 27.80 kg/m , BMI Body mass index is 27.8 kg/m.  Wt Readings from Last 3 Encounters:  09/29/24 152 lb (68.9 kg)  07/07/24 152 lb 12.8 oz (69.3 kg)  05/29/24 158 lb 9.6 oz (71.9 kg)    General: Patient appears comfortable at rest. HEENT: Conjunctiva and lids normal. Neck: Supple, no elevated JVP or carotid bruits. Lungs: Clear to auscultation, nonlabored breathing at rest. Cardiac: Regular rate and rhythm, no S3 or significant systolic murmur.  ECG:  An ECG dated 05/21/2024 was personally reviewed today and demonstrated:  Sinus tachycardia with nonspecific ST-T changes, Q-wave in lead III.  Labwork: 05/18/2024: B Natriuretic Peptide 74.0; Magnesium  2.1; TSH 1.955 05/21/2024: Hemoglobin 13.3; Platelets 207 06/27/2024: BUN 13; Creatinine, Ser 0.88; Potassium 4.4; Sodium 141 07/05/2024: ALT 45; AST 42     Component Value Date/Time   CHOL 93 (L) 07/05/2024 1040   TRIG 145 07/05/2024 1040   HDL 28 (L) 07/05/2024 1040   CHOLHDL 3.3 07/05/2024 1040   CHOLHDL 4.3 05/20/2024 0328   VLDL 29 05/20/2024 0328   LDLCALC 40 07/05/2024 1040   LDLCALC 66 04/13/2024 0905   Other Studies Reviewed Today:  Echocardiogram 09/12/2024 [facility]): Summary   1. The left ventricle is normal in size with normal wall thickness.    2. The left ventricular systolic function is mildly decreased, LVEF is  visually estimated at 45-50%. There is global hypokinesis.    3. There is grade I diastolic dysfunction (impaired relaxation).    4. The right ventricle is normal in size, with normal systolic function.    5. There are no significant valvular abnormalities.   Assessment and Plan:  1.  CAD status post NSTEMI with DES x 2 to the mid LAD in September 2025.  No angina at this time.  Continue aspirin  81 mg daily, Brilinta  90 mg twice daily, and Crestor  40 mg daily.   2.  HFmrEF with history of ischemic cardiomyopathy, potentially myocardial stunning with ACS in September 2025.  Follow-up echocardiogram in January of this year showed improvement in LVEF to the range of 45 to 50%.  Clinically stable with NYHA class II dyspnea.  Continue Farxiga  5 mg daily, Cozaar  12.5 mg daily, and Toprol -XL 25 mg daily.   3.  Status post acute thalamic stroke in September 2025.  She remains on antiplatelet regimen and statin.   4.  Mixed hyperlipidemia, follow-up LDL 40 in November 2025.  Continue Crestor  40 mg daily.  Due for follow-up LFTs which are already ordered.   5.  Left upper lobe segmental and subsegmental pulmonary embolus diagnosed in April 2025 with no evidence  of RV strain.  She completed a 19-month course of Eliquis  with follow-up per hematology and no plan for chronic anticoagulation.  Disposition:  Follow up 6 months.  Signed, Jayson JUDITHANN Sierras, M.D., F.A.C.C. [facility] at [facility] "
